# Patient Record
Sex: Female | Born: 1965
Health system: Southern US, Community
[De-identification: ages and names within clinical notes are randomized; demographics above are authoritative.]

## PROBLEM LIST (undated history)

## (undated) DIAGNOSIS — I10 Essential (primary) hypertension: Secondary | ICD-10-CM

## (undated) DIAGNOSIS — F419 Anxiety disorder, unspecified: Secondary | ICD-10-CM

## (undated) HISTORY — PX: ABDOMINAL HYSTERECTOMY: SHX81

---

## 2001-02-20 ENCOUNTER — Encounter: Admission: RE | Admit: 2001-02-20 | Discharge: 2001-02-20 | Payer: Self-pay | Admitting: *Deleted

## 2001-02-20 ENCOUNTER — Encounter: Payer: Self-pay | Admitting: *Deleted

## 2001-06-11 ENCOUNTER — Other Ambulatory Visit: Admission: RE | Admit: 2001-06-11 | Discharge: 2001-06-11 | Payer: Self-pay | Admitting: Obstetrics and Gynecology

## 2003-04-03 ENCOUNTER — Other Ambulatory Visit: Admission: RE | Admit: 2003-04-03 | Discharge: 2003-04-03 | Payer: Self-pay | Admitting: Obstetrics and Gynecology

## 2003-05-12 ENCOUNTER — Ambulatory Visit (HOSPITAL_COMMUNITY): Admission: RE | Admit: 2003-05-12 | Discharge: 2003-05-12 | Payer: Self-pay | Admitting: Obstetrics and Gynecology

## 2003-05-12 ENCOUNTER — Encounter: Payer: Self-pay | Admitting: Obstetrics and Gynecology

## 2004-07-08 ENCOUNTER — Other Ambulatory Visit: Admission: RE | Admit: 2004-07-08 | Discharge: 2004-07-08 | Payer: Self-pay | Admitting: Obstetrics and Gynecology

## 2005-09-10 ENCOUNTER — Other Ambulatory Visit: Admission: RE | Admit: 2005-09-10 | Discharge: 2005-09-10 | Payer: Self-pay | Admitting: Obstetrics and Gynecology

## 2007-04-12 ENCOUNTER — Encounter (INDEPENDENT_AMBULATORY_CARE_PROVIDER_SITE_OTHER): Payer: Self-pay | Admitting: Obstetrics and Gynecology

## 2007-04-12 ENCOUNTER — Ambulatory Visit (HOSPITAL_COMMUNITY): Admission: RE | Admit: 2007-04-12 | Discharge: 2007-04-12 | Payer: Self-pay | Admitting: Obstetrics and Gynecology

## 2007-12-02 ENCOUNTER — Emergency Department (HOSPITAL_COMMUNITY): Admission: EM | Admit: 2007-12-02 | Discharge: 2007-12-02 | Payer: Self-pay | Admitting: Emergency Medicine

## 2008-12-28 ENCOUNTER — Encounter (INDEPENDENT_AMBULATORY_CARE_PROVIDER_SITE_OTHER): Payer: Self-pay | Admitting: Obstetrics and Gynecology

## 2008-12-28 ENCOUNTER — Ambulatory Visit (HOSPITAL_COMMUNITY): Admission: RE | Admit: 2008-12-28 | Discharge: 2008-12-29 | Payer: Self-pay | Admitting: Obstetrics and Gynecology

## 2011-01-23 LAB — COMPREHENSIVE METABOLIC PANEL
ALT: 28 U/L (ref 0–35)
AST: 30 U/L (ref 0–37)
Albumin: 4.1 g/dL (ref 3.5–5.2)
Alkaline Phosphatase: 48 U/L (ref 39–117)
BUN: 11 mg/dL (ref 6–23)
CO2: 26 mEq/L (ref 19–32)
Calcium: 9.3 mg/dL (ref 8.4–10.5)
Chloride: 105 mEq/L (ref 96–112)
Creatinine, Ser: 0.48 mg/dL (ref 0.4–1.2)
GFR calc Af Amer: >60 mL/min (ref >60)
GFR calc non Af Amer: >60 mL/min (ref >60)
Glucose, Bld: 80 mg/dL (ref 70–99)
Potassium: 3.8 mEq/L (ref 3.5–5.1)
Sodium: 137 mEq/L (ref 135–145)
Total Bilirubin: 0.7 mg/dL (ref 0.3–1.2)
Total Protein: 7.5 g/dL (ref 6.0–8.3)

## 2011-01-23 LAB — CBC
HCT: 31.5 % — ABNORMAL LOW (ref 36.0–46.0)
HCT: 37.9 % (ref 36.0–46.0)
Hemoglobin: 10.3 g/dL — ABNORMAL LOW (ref 12.0–15.0)
Hemoglobin: 12.3 g/dL (ref 12.0–15.0)
MCHC: 32.5 g/dL (ref 30.0–36.0)
MCHC: 32.8 g/dL (ref 30.0–36.0)
MCV: 83.2 fL (ref 78.0–100.0)
MCV: 83.8 fL (ref 78.0–100.0)
Platelets: 286 10*3/uL (ref 150–400)
Platelets: 331 10*3/uL (ref 150–400)
RBC: 3.75 MIL/uL — ABNORMAL LOW (ref 3.87–5.11)
RBC: 4.56 MIL/uL (ref 3.87–5.11)
RDW: 13.3 % (ref 11.5–15.5)
RDW: 13.3 % (ref 11.5–15.5)
WBC: 5.1 10*3/uL (ref 4.0–10.5)
WBC: 9.8 10*3/uL (ref 4.0–10.5)

## 2011-02-25 NOTE — Discharge Summary (Signed)
NAMEERMINIA, MCNEW             ACCOUNT NO.:  [id number]   MEDICAL RECORD NO.:  [id number]          PATIENT TYPE:  OIB   LOCATION:  9308                          FACILITY:  [facility]   PHYSICIAN:  Dineen Kid. Rana Snare, M.D.    DATE OF BIRTH:  10-28-1965   DATE OF ADMISSION:  12/28/2008  DATE OF DISCHARGE:  12/29/2008                               DISCHARGE SUMMARY   Ms. Kinkaid is a 45 year old G1, P1, status post tubal ligation and  NovaSure endometrial ablation.  She is having worsened problems with  pelvic pain, which is equivocal in nature, does not have any bleeding at  this time since she has had her NovaSure.  However, her pain is  incapacitating and also monthly, currently it is about 3-4 days a month,  which does keep her from performing her social activities or working.  She also has menstrually related migraines, followed by headache  specialist.  She presents for hysterectomy with removal of both tubes  and ovaries.   HOSPITAL COURSE:  The patient underwent a laparoscopically assisted  vaginal hysterectomy with bilateral salpingo-oophorectomy.  The surgery  was uncomplicated with estimated blood loss of 200 mL.  Her  postoperative care was unremarkable.  On postoperative day #1, she was  ambulating and tolerating regular diet, able to void without difficulty.  She had normal active bowel sounds.  Her postoperative hemoglobin is  10.3, and pain was well managed with oral medication, and hot flushes  controlled with Climara 0.1 mg.   DISPOSITION:  The patient will be discharged home and will follow up in  the office in 1-2 weeks.  She is sent home with a prescription for  Gynodiol 2 mg daily and Percocet #30.  Told to return for increased  pain, fever, or bleeding.      Dineen Kid Rana Snare, M.D.  Electronically Signed     DCL/MEDQ  D:  12/29/2008  T:  12/29/2008  Job:  [id number]

## 2011-02-25 NOTE — H&P (Signed)
[name], [name]             ACCOUNT NO.:  [id number]   MEDICAL RECORD NO.:  [id number]          PATIENT TYPE:  AMB   LOCATION:  SDC                           FACILITY:  [facility]   PHYSICIAN:  Dineen Kid. Rana Snare, M.D.    DATE OF BIRTH:  Jul 24, 1966   DATE OF ADMISSION:  12/28/2008  DATE OF DISCHARGE:                              HISTORY & PHYSICAL   HISTORY OF PRESENT ILLNESS:  [name] is a 45 year old G1, P1,  status post tubal ligation with worsening problems with pelvic pain  which is cyclical in nature.  She is not having bleeding at this time.  She has had a NovaSure endometrial ablation but her pain is  incapacitating and it is also monthly.  Cramping currently is 3-4 days a  month.  She does take anti-inflammatory medications on a regular basis  when this occurs, but does miss work and social activities because of  the pain.  She also has a problem with menstrual migraines.  She is  followed by headache specialist.  Headaches are also in a cyclical  fashion consistent with the menstrual migraines.  We tried Depo-Provera,  it did not really help at all.  She has had a lengthy discussion with  her neurologist and they think that removal of both ovaries and  eventually putting her into menopause would be the recommended route,  and she does wish to proceed with that recommendation.   PAST MEDICAL HISTORY:  Anxiety and migraines.   MEDICATIONS:  1. Verapamil 240 mg daily.  2. Effexor 37.5 mg daily.   PAST SURGICAL HISTORY:  She had a cesarean section in 1991.  She had an  endometrial ablation and tubal ligation in June 2008.  She has an  allergy to amoxicillin and Macrobid.   PHYSICAL EXAMINATION:  VITAL SIGNS:  Blood pressure 118/68,  HEART:  Regular rate and rhythm.  LUNGS:  Clear to auscultation bilaterally.  ABDOMEN:  Nondistended, nontender.  PELVIC:  Exam of the uterus is anteverted, mobile.  Tender to deep  palpation.  No masses are palpable.  She has no significant  cystocele or  rectocele.   IMPRESSION AND PLAN:  Pelvic pain, cyclical nature and cyclical  menstrual migraines, not responding to conservative management.  The  patient desires removal of both ovaries as well as removal of the  uterus.  Plan LAVH with BSO.  Had a lengthy discussion with the patient  with regards to the possibility of preserving or removing one or both  ovaries.  Discussed menopause.  Discussed hormone replacement therapy.  Discussed the risks and benefits of all the above including the [facility].  She does also have a history of endometriosis and  again wants to proceed with hysterectomy with removal of both tubes and  ovaries.  Discussed the procedure at  length which includes but is not limited to risk of infection, bleeding,  damage to bowel, bladder, ureters, ovaries, risks associated with  anesthesia with associated blood transfusion, possibly this may not  alleviate the pain, but could recur or worsen if she does give her  informed  consent and wishes to proceed.      Dineen Kid Rana Snare, M.D.  Electronically Signed     DCL/MEDQ  D:  12/27/2008  T:  12/28/2008  Job:  [id number]

## 2011-02-25 NOTE — Op Note (Signed)
NAMEMARILI, [name]             ACCOUNT NO.:  [id number]   MEDICAL RECORD NO.:  [id number]          PATIENT TYPE:  OIB   LOCATION:  9308                          FACILITY:  [facility]   PHYSICIAN:  Dineen Kid. Rana Snare, M.D.    DATE OF BIRTH:  12-28-1965   DATE OF PROCEDURE:  12/28/2008  DATE OF DISCHARGE:                               OPERATIVE REPORT   PREOPERATIVE DIAGNOSES:  Pelvic pain and menstrual-related migraines and  history of endometriosis.   POSTOPERATIVE DIAGNOSES:  Pelvic pain and menstrual-related migraines  and history of endometriosis.   PROCEDURE:  Laparoscopic-assisted vaginal hysterectomy with bilateral  salpingo-oophorectomy.   SURGEON:  Dineen Kid. Rana Snare, MD   ASSISTANT:  Zelphia Cairo, MD   INDICATIONS:  Ms. Dadisman is a 45 year old G1, P1 status post tubal  ligation with worsening of problems with pelvic pain which is cyclical  in nature.  She is not having bleeding at this time since she had  NovaSure endometrial ablation, but she does have incapacitating pain 3-4  days amongst which does prevent her from functioning normally both  socially and at work not responding to anti-inflammatory medications or  Depo-Provera.  She also has menstrual-related migraines, is followed by  headache specialist for this.  They are in a cyclical fashion.  She  desires removal of both ovaries.  Because of this, the risks and  benefits of the procedure were discussed at length, which include but  not limited to risk of infection, bleeding, damage to bowel, bladder,  ureters, risks associated with anesthesia, blood transfusion, possibly  this may not alleviate the pain, it could recur or worsen, risks  associated with hormone replacement therapy and menopause were also  discussed, gives informed consent, and wished to proceed.   FINDINGS:  At the time of surgery were normal-appearing uterus, tubes,  and ovaries, normal-appearing appendix and liver.   DESCRIPTION OF PROCEDURE:   After adequate analgesia, the patient placed  in the dorsal lithotomy position.  She was sterilely prepped and draped.  Bladder sterilely drained.  Graves speculum was placed.  Tenaculum was  placed on anterior lip of the cervix and a Cohen tenaculum was then  placed.  A 1-cm infraumbilical skin incision was made.  A Veress needle  was inserted.  The abdomen was insufflated, dullness to percussion.  A  11-mm trocar was inserted.  The above findings were noted by  laparoscope.  A 5-mm trocar was inserted to the left midline 2  fingerbreadths above the pubic symphysis.  Gyrus cutting forceps were  used to grasp and dissect along the right infundibulopelvic ligament  down to the round ligament.  The left infundibulopelvic ligament was  similarly grasped, coagulated, and dissected down across the round  ligament with the ovaries and fallopian tubes falling towards the  uterus.  Good hemostasis noted and care was taken to avoid the  underlying ureter and bowel.  The bladder flap was then elevated and the  gyrus was used to create a vesicouterine flap.  The abdomen was then  desufflated.  The legs repositioned.  Weighted speculum was placed in  the vagina,  posterior colpotomy was performed.  The cervix was  circumscribed with Bovie cautery.  LigaSure was then used to ligate  across the uterosacral ligaments bilaterally.  The cardinal ligaments in  the bladder pillars bilaterally dissected with Mayo scissors.  The  anterior vaginal mucosa was dissected off the anterior cervix and  anterior peritoneum was entered sharply.  Deaver retractor placed  underneath the bladder.  The inferior portion of the broad ligament were  then dissected and ligated with LigaSure instrument.  The uterus,  fallopian tubes, and ovary were then removed.  The uterosacral ligaments  were identified and ligated with 0-Monocryl suture in a figure-of-eight  fashion bilaterally.  The cul-de-sac was closed with pursestring  fashion  with 0-Monocryl suture.  The vagina was then closed in a vertical  fashion using figure-of-eights of 0-Monocryl with good closure and good  hemostasis noted.  Foley catheter was then placed with return of clear  yellow urine.  Legs were repositioned, abdomen reinsufflated, and Nezhat  suction irrigator was used to irrigate the pelvis.  Small peritoneal  bleeders were coagulated with bipolar cautery.  Pedicles were checked  and were noted be hemostatic.  The underlying ureter showed good  peristalsis bilaterally and cul-de-sac was clean and dry.  The abdomen  was then desufflated, trocars removed.  The infraumbilical skin incision  was closed with 0-Vicryl interrupted suture, the fascia 3-0 Vicryl  Rapide subcuticular suture, the 5-mm site was closed with 3-0 Vicryl  Rapide subcuticular suture, and Dermabond.  Incisions were then injected  with 0.25% Marcaine, total 10 mL used.  The patient tolerated the  procedure well, was stable on transfer to recovery room.  Sponge and  instrument count was normal x3.  The patient did receive clindamycin and  Cipro preoperatively.      Dineen Kid Rana Snare, M.D.  Electronically Signed     DCL/MEDQ  D:  12/28/2008  T:  12/28/2008  Job:  [id number]

## 2011-02-28 NOTE — Op Note (Signed)
[name], [name]             ACCOUNT NO.:  [id number]   MEDICAL RECORD NO.:  [id number]          PATIENT TYPE:  AMB   LOCATION:  SDC                           [facility]   PHYSICIAN:  [name], M.D.    DATE OF BIRTH:  1966-07-07   DATE OF PROCEDURE:  04/12/2007  DATE OF DISCHARGE:                               OPERATIVE REPORT   PREOPERATIVE DIAGNOSES:  1. Abnormal uterine bleeding.  2. Menorrhagia.  3. Dysmenorrhea.  4. Pelvic pain.  5. Family history of endometriosis.  6. Submucosal fibroids.  7. Desires sterility.   POSTOPERATIVE DIAGNOSES:  1. Abnormal uterine bleeding.  2. Menorrhagia.  3. Dysmenorrhea.  4. Pelvic pain.  5. Family history of endometriosis.  6. Submucosal fibroids.  7. Desires sterility.  8. Mild endometriosis.   SURGEON:  Dr. Candice Camp.   ANESTHESIA:  General endotracheal.   PROCEDURE:  1. Laparoscopy with ablation of endometriosis implants.  2. Bilateral tubal ligation.  3. Hysteroscopy with dilation and curettage.  4. NovaSure endometrial ablation.   FINDINGS AT TIME OF SURGERY:  Retroflexed uterus with normal-appearing  ovaries, fallopian tubes.  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  The left uterosacral ligament had endometriosis implants with a small  McMaster's window.  The right uterosacral ligament into the ovarian  fossa had several small 2- to 3-mm endometriosis implants with some  puckering peritoneum.  Normal-appearing liver.  The appendix was  retrocecal.  On hysteroscopy, she does have a submucosal fibroid  anteriorly near the fundus which has only a small portion of  intracavitary component.  Otherwise normal-appearing ostia and uterus.   ESTIMATED BLOOD LOSS:  Was minimal.   SORBITOL DEFICIT:  Was 200 mL.   DESCRIPTION OF PROCEDURE:  After adequate analgesia, the patient placed  in the dorsal lithotomy position, she was sterilely prepped and draped.  Bladder was sterilely drained.  Graves speculum  was placed.  A Cohen  tenaculum was placed on the cervix.  Legs were repositioned.  A 1-cm  infraumbilical skin incision was made.  A Veress needle was inserted.  The abdomen was insufflated to auscultation and percussion.  An 11-mm  trocar was inserted.  Laparoscope was inserted.  The above findings were  noted.  A 5-mm trocar was inserted to the left of the midline 2  fingerbreadths above the pubic symphysis.  Ablation of endometriosis was  carried out using bipolar cautery, care taken to avoid the underlying  ureter and bowel, with good ablation of the endometriosis implants  noted.  The McMaster's window was grasped at the base of the implant and  ablated with closure of the McMaster's window noted.  The left fallopian  tube was identified by fimbriated end.  Midportion of tube was grasped.  Bipolar cautery was used to cauterize an approximately 2- to 3-cm  section of fallopian tube with good thermal burn noted across the  entirety the tube and loss of resistance on ohmmeter.  The right  fallopian tube was identified by fimbriated end.  Midportion of tube  grasped with the bipolar cautery.  Cauterization was carried out over  a  2- to 3-cm section of the fallopian tube with good thermal burn noted  and also loss of resistance on the ohmmeter.  The 5-mm trocar site was  removed.  Good hemostasis was noted to be achieved.  This trocar was  then removed from the umbilicus.  Abdomen was desufflated.  Infraumbilical skin incision was closed with a 0 Vicryl figure-of-eight  in the fascia, 3-0 Vicryl Rapide subcuticular suture.  The 5-mm site was  closed with a 3-0 Vicryl Rapide subcuticular suture and then Dermabond.  The incisions were injected with 0.25% Marcaine, a total of 10 mL used.  The legs were repositioned.  Tenaculum placed on the anterior lip of the  cervix.  Uterus was sounded to 8 cm with a cervical length of 4 cm for a  total cavity length of 4 cm.  Hysteroscope was inserted  after dilation  and noted to have small irregularities on the posterior wall.  The  curettage was performed, retrieving small fragments of endometrium.  Reexamination with the hysteroscope revealed minimal intracavitary  component of the submucosal fibroid, so resection was not carried out  because of this and due to severe retroflexion of the uterus.  The  NovaSure device was placed with a cavity with a 4.5-cm.  Cavity  assessment test was carried out.  After that had passed, the device was  activated with a power of 99 watts for total of 1 minute 35 seconds.  The device was then removed.  Reexamination of the hysteroscope revealed  good thermal burn.  No obvious complications were noted.  The  hysteroscope was removed.  Tenaculum removed from cervix.  Cervix noted  to be hemostatic.  Speculum was then removed.  The patient was  transferred to the recovery room in stable condition.  Sponge and  instrument count was normal x3.  Estimated blood loss was minimal.  Sorbitol deficit was 200 mL.  Again, the sponge, needle and instrument  count was normal x3.  The patient received clindamycin 900 mg and  gentamicin 120 mg preoperatively, Toradol 30 mg postoperatively.   DISPOSITION:  The patient will be discharged home.  Will follow-up in  the office in 2 to 3 weeks.  Sent home with a routine instruction sheet  for laparoscopy and for D&C.  Told to return for increased pain, fever  or bleeding.  Sent home with a prescription for Vicodin.      Terri Kid Rana Moran, M.D.  Electronically Signed     DCL/MEDQ  D:  04/12/2007  T:  04/12/2007  Job:  [id number]

## 2011-02-28 NOTE — H&P (Signed)
[name], [name]             ACCOUNT NO.:  [id number]   MEDICAL RECORD NO.:  [id number]          PATIENT TYPE:  AMB   LOCATION:  SDC                           [facility]ACILITY[facility]   PHYSICIAN:  Dineen Kid. Rana Snare, M.D.    DATE OF BIRTH:  08-Mar-1966   DATE OF ADMISSION:  04/12/2007  DATE OF DISCHARGE:                              HISTORY & PHYSICAL   HISTORY OF PRESENT ILLNESS:  [name] is a 45 year old G1, P1 with  worsening abnormal uterine bleeding, dysmenorrhea, menorrhagia, pelvic  pain, family history of endometriosis.  Also, on saline infusion  ultrasound, she has a submucosal fibroid.  She desires definitive  surgical intervention such as a hysterectomy but insurance at this time  has denied this.  So, at this time, we plan to proceed with laparoscopic  evaluation of the endometriosis with tubal ligation, as she does no  longer have childbearing desires and possible evaluation and treatment  of endometriosis.  She also desires resection of the submucosal fibroid  and Novasure endometrial ablation for the menorrhagia.  Saline infusion  ultrasound shows a small left ovarian cyst.  Right ovary is normal.  Uterus is 7.7 x 3.6 x 4.8 cm in size, a 9 mm submucosal fibroid is seen  with half of it in the endometrial cavity.  The other half is below the  endometrium.   PHYSICAL EXAMINATION:  PELVIC:  The uterus is anteverted, mobile,  nontender.  No uterine sacral nodularity is palpable.  HEART:  Regular rate and rhythm.  LUNGS:  Clear to auscultation bilaterally.  ABDOMEN:  Nondistended, nontender.  VITAL SIGNS:  Blood pressure is 112/70.  Her weight is 119.   PAST MEDICAL HISTORY:  Negative.  She has had a cesarean section in the  past.   She has an allergy to AMOXICILLIN.   IMPRESSION/PLAN:  Abnormal uterine bleeding, menorrhagia, dysmenorrhea,  pelvic pain, family history of endometriosis, submucosal fibroids, and  also desires sterility.  Plan laparoscopic tubal ligation,  possible  ablation of endometriosis or lysis of adhesions.  Also plan  hysteroscopic resection of the submucosal fibroid and NovaSure  endometrial ablation.  Discussed the risks and benefits of the above  procedures at length, which include but are not limited to, risk of  infection, bleeding, damage to the ureter, tubes, ovaries,  bowel/bladder, risk of tubal failure, quoted at 5 out of 1000.  I  discussed the success rates and complication rates associated with the  NovaSure ablation technique.  I discussed the risks associated with a  blood transfusion, general anesthesia.  The possibility of these  procedures may not alleviate the pain, it could recur or worsen.  The  same goes also for the abnormal bleeding.  She does give her informed  consent and wishes to proceed.      Dineen Kid Rana Snare, M.D.  Electronically Signed     DCL/MEDQ  D:  04/09/2007  T:  04/09/2007  Job:  [id number]

## 2011-02-28 NOTE — Discharge Summary (Signed)
NAMEJEROLENE, [name]             ACCOUNT NO.:  [id number]   MEDICAL RECORD NO.:  [id number]          PATIENT TYPE:  OIB   LOCATION:  9308                          [facility]ACILITY[facility]   PHYSICIAN:  Dineen Kid. Rana Snare, M.D.    DATE OF BIRTH:  [birth year]   DATE OF ADMISSION:  12/28/2008  DATE OF DISCHARGE:  12/29/2008                               DISCHARGE SUMMARY   HISTORY OF PRESENT ILLNESS:  [name] is a 45 year old G1, P1 status  post tubal ligation with worsening problems of pain, cyclical in nature,  having bleeding at this time, states she has had NovaSure endometrial  ablation, but she does have incapacitating pain, which is also monthly  and cramping, 3-4 days a month.  She takes antiinflammatory medications  on a regular basis when this does occur and does miss work and social  activities because of her pain.  Also, has a problem with menstrual  migraines, followed by a headache specialist.  They are cyclical in  nature.  We have tried Depo-Provera without any help at home.  After a  lengthy discussion with her neurologist and the patient, she desires  removal of both ovaries and effectively put an end to her surgical  menopause to help with the migraines.  She presents for LAVH and BSO.  Risks and benefits were discussed.  Informed consent was obtained.   HOSPITAL COURSE:  The patient underwent an LAVH and BSO.  The surgery  was uncomplicated.  Her blood loss at the time of surgery was 200 mL.  There were no complications.  Her postoperative care was unremarkable.  By postop day #1, she was ambulating without difficulty and tolerating a  regular diet.  Her postop hemoglobin was 10.3.  The incision was clean,  dry, and intact.  She had a normoactive bowel sounds and the patient was  discharged home.   DISPOSITION:  The patient was discharged home.  Followup in the office  in 1-2 weeks.  Given the routine instruction sheet for hysterectomy.  Told to return for increased pain,  fever, or bleeding.  She also has a  prescription for Percocet #20.      Dineen Kid Rana Snare, M.D.  Electronically Signed     DCL/MEDQ  D:  01/22/2009  T:  01/23/2009  Job:  [id number]

## 2011-07-07 LAB — POCT CARDIAC MARKERS
CKMB, poc: 2.8
Myoglobin, poc: 148
Operator id: 288831
Troponin i, poc: <0.05
Troponin i, poc: <0.05

## 2011-07-07 LAB — URINALYSIS, ROUTINE W REFLEX MICROSCOPIC
Glucose, UA: NEGATIVE
Nitrite: NEGATIVE
Protein, ur: NEGATIVE
Urobilinogen, UA: 1

## 2011-07-07 LAB — I-STAT 8, (EC8 V) (CONVERTED LAB)
BUN: 10
Bicarbonate: 23.3
Chloride: 108
Glucose, Bld: 81
HCT: 40
Hemoglobin: 13.6
Operator id: 114141
Sodium: 140
pCO2, Ven: 38.4 — ABNORMAL LOW

## 2011-07-07 LAB — DIFFERENTIAL
Basophils Absolute: 0
Eosinophils Absolute: 0.1
Eosinophils Relative: 2
Lymphocytes Relative: 21
Monocytes Absolute: 0.6

## 2011-07-07 LAB — CBC
HCT: 36.5
Hemoglobin: 12
MCV: 80.1
Platelets: 328
RDW: 13.2

## 2011-07-07 LAB — D-DIMER, QUANTITATIVE: D-Dimer, Quant: <0.22

## 2011-07-30 LAB — CBC
HCT: 34.4 — ABNORMAL LOW
Hemoglobin: 11.2 — ABNORMAL LOW
MCHC: 32.5
MCV: 79.7
Platelets: 361
RBC: 4.32
RDW: 13.6
WBC: 7.8

## 2011-07-30 LAB — PREGNANCY, URINE: Preg Test, Ur: NEGATIVE

## 2014-11-08 ENCOUNTER — Other Ambulatory Visit: Payer: Self-pay | Admitting: Obstetrics & Gynecology

## 2014-11-09 LAB — CYTOLOGY - PAP

## 2015-10-16 MED FILL — ESTRADIOL 1 MG TABLET: 1 | 90 days supply | Qty: 90 | Fill #1

## 2015-11-12 MED FILL — VENLAFAXINE HCL ER 150 MG C: 150 | 90 days supply | Qty: 90 | Fill #0

## 2015-11-30 MED FILL — ESTRADIOL 2 MG TABLET: 2 | 30 days supply | Qty: 30 | Fill #0

## 2016-01-21 MED FILL — ESTRADIOL 2 MG TABLET: 2 | 30 days supply | Qty: 30 | Fill #1

## 2016-02-20 MED FILL — ESTRADIOL 2 MG TABLET: 2 | 30 days supply | Qty: 30 | Fill #2

## 2016-03-05 MED FILL — VENLAFAXINE HCL ER 150 MG C: 150 | 90 days supply | Qty: 90 | Fill #1

## 2016-04-01 MED FILL — ESTRADIOL 2 MG TABLET: 2 | 30 days supply | Qty: 30 | Fill #3

## 2016-05-07 MED FILL — ESTRADIOL 2 MG TABLET: 2 | 30 days supply | Qty: 30 | Fill #4

## 2016-06-23 MED FILL — VENLAFAXINE HCL ER 150 MG C: 150 | 90 days supply | Qty: 90 | Fill #2

## 2016-06-25 MED FILL — ESTRADIOL 2 MG TABLET: 2 | 30 days supply | Qty: 30 | Fill #5

## 2016-07-28 DIAGNOSIS — H3562 Retinal hemorrhage, left eye: Secondary | ICD-10-CM | POA: Diagnosis not present

## 2016-08-07 MED FILL — ESTRADIOL 2 MG TABLET: 2 | 30 days supply | Qty: 30 | Fill #6

## 2016-09-10 MED FILL — ESTRADIOL 2 MG TABLET: 2 | 30 days supply | Qty: 30 | Fill #7

## 2016-10-16 MED FILL — ESTRADIOL 2 MG TABLET: 2 | 30 days supply | Qty: 30 | Fill #8

## 2016-10-20 MED FILL — VENLAFAXINE HCL ER 150 MG C: 150 | 90 days supply | Qty: 90 | Fill #3

## 2016-10-30 ENCOUNTER — Emergency Department (HOSPITAL_BASED_OUTPATIENT_CLINIC_OR_DEPARTMENT_OTHER)
Admission: EM | Admit: 2016-10-30 | Discharge: 2016-10-30 | Disposition: A | Discharge status: Home / Self Care | Payer: BLUE CROSS/BLUE SHIELD | Attending: Emergency Medicine | Admitting: Emergency Medicine

## 2016-10-30 ENCOUNTER — Encounter (HOSPITAL_BASED_OUTPATIENT_CLINIC_OR_DEPARTMENT_OTHER): Payer: Self-pay | Admitting: Emergency Medicine

## 2016-10-30 DIAGNOSIS — N390 Urinary tract infection, site not specified: Secondary | ICD-10-CM | POA: Insufficient documentation

## 2016-10-30 DIAGNOSIS — R300 Dysuria: Secondary | ICD-10-CM | POA: Diagnosis present

## 2016-10-30 HISTORY — DX: Anxiety disorder, unspecified: F41.9

## 2016-10-30 LAB — URINALYSIS, MICROSCOPIC (REFLEX): RBC / HPF: NONE SEEN RBC/hpf (ref 0–5)

## 2016-10-30 LAB — URINALYSIS, ROUTINE W REFLEX MICROSCOPIC
GLUCOSE, UA: NEGATIVE mg/dL
HGB URINE DIPSTICK: NEGATIVE
KETONES UR: 15 mg/dL — AB
NITRITE: POSITIVE — AB
PH: 7.5 (ref 5.0–8.0)
Protein, ur: 30 mg/dL — AB
Specific Gravity, Urine: 1.022 (ref 1.005–1.030)

## 2016-10-30 MED ORDER — CIPROFLOXACIN HCL 500 MG PO TABS: 500.0000 mg | ORAL_TABLET | Freq: Two times a day (BID) | ORAL | 0 refills | Status: DC

## 2016-10-30 MED ORDER — ONDANSETRON HCL 4 MG PO TABS: 4.0000 mg | ORAL_TABLET | Freq: Four times a day (QID) | ORAL | 0 refills | Status: DC

## 2016-10-30 MED FILL — CIPROFLOXACIN HCL 500 MG TA: 500 | 7 days supply | Qty: 14 | Fill #0

## 2016-10-30 MED FILL — ONDANSETRON HCL 4 MG TABLET: 4 | 3 days supply | Qty: 12 | Fill #0

## 2016-10-30 NOTE — ED Triage Notes (Signed)
Patient states that she has had pain with urination for the last 2 days.

## 2016-10-30 NOTE — Discharge Instructions (Signed)
Please read attached information. If you experience any new or worsening signs or symptoms please return to the emergency room for evaluation. Please follow-up with your primary care provider or specialist as discussed. Please use medication prescribed only as directed and discontinue taking if you have any concerning signs or symptoms.   °

## 2016-10-30 NOTE — ED Provider Notes (Signed)
[facility] Provider Note   CSN: [id number] Arrival date & time: 10/30/16  1253     History   Chief Complaint Chief Complaint  Patient presents with  . Dysuria    HPI [name] is a 51 y.o. female.  HPI   51 year old female presents today with complaints of dysuria. Patient reports symptoms started 3 days ago with burning with urination. She notes very minor suprapubic pain, and pain to her right flank. Patient notes that she took AZO last night, had one episode of diarrhea and vomiting today. Patient reports she's been afebrile, called her primary care this morning who instructed her to come to the emergency room.   Past Medical History:  Diagnosis Date  . Anxiety     There are no active problems to display for this patient.   Past Surgical History:  Procedure Laterality Date  . ABDOMINAL HYSTERECTOMY      OB History    No data available       Home Medications    Prior to Admission medications   Medication Sig Start Date End Date Taking? Authorizing Provider  venlafaxine (EFFEXOR) 75 MG tablet Take 75 mg by mouth 2 (two) times daily.   Yes Historical Provider, MD  ciprofloxacin (CIPRO) 500 MG tablet Take 1 tablet (500 mg total) by mouth 2 (two) times daily. 10/30/16   Tinnie GensJeffrey Nazly Digilio, PA-C  ondansetron (ZOFRAN) 4 MG tablet Take 1 tablet (4 mg total) by mouth every 6 (six) hours. 10/30/16   Eyvonne MechanicJeffrey Essance Gatti, PA-C    Family History History reviewed. No pertinent family history.  Social History Social History  Substance Use Topics  . Smoking status: Never Smoker  . Smokeless tobacco: Never Used  . Alcohol use Yes     Comment: occ     Allergies   Macrobid [nitrofurantoin macrocrystal] and Amoxicillin   Review of Systems Review of Systems  All other systems reviewed and are negative.    Physical Exam Updated Vital Signs BP 101/73 (BP Location: Right Arm)   Pulse 105   Temp 99.6 F (37.6 C) (Oral)   Resp 16   Ht 4' 10.5"  (1.486 m)   Wt 58.1 kg   SpO2 97%   BMI 26.30 kg/m   Physical Exam  Constitutional: She is oriented to person, place, and time. She appears well-developed and well-nourished.  HENT:  Head: Normocephalic and atraumatic.  Eyes: Conjunctivae are normal. Pupils are equal, round, and reactive to light. Right eye exhibits no discharge. Left eye exhibits no discharge. No scleral icterus.  Neck: Normal range of motion. No JVD present. No tracheal deviation present.  Pulmonary/Chest: Effort normal. No stridor.  Abdominal: Soft. She exhibits no distension.  No CVA tenderness, minor suprapubic tenderness to palpation, remainder of abdominal exam benign  Neurological: She is alert and oriented to person, place, and time. Coordination normal.  Skin: Skin is warm.  Psychiatric: She has a normal mood and affect. Her behavior is normal. Judgment and thought content normal.  Nursing note and vitals reviewed.    ED Treatments / Results  Labs (all labs ordered are listed, but only abnormal results are displayed) Labs Reviewed  URINALYSIS, ROUTINE W REFLEX MICROSCOPIC - Abnormal; Notable for the following:       Result Value   Color, Urine ORANGE (*)    Bilirubin Urine SMALL (*)    Ketones, ur 15 (*)    Protein, ur 30 (*)    Nitrite POSITIVE (*)    Leukocytes,  UA SMALL (*)    All other components within normal limits  URINALYSIS, MICROSCOPIC (REFLEX) - Abnormal; Notable for the following:    Bacteria, UA MANY (*)    Squamous Epithelial / LPF 6-30 (*)    All other components within normal limits    EKG  EKG Interpretation None       Radiology No results found.  Procedures Procedures (including critical care time)  Medications Ordered in ED Medications - No data to display   Initial Impression / Assessment and Plan / ED Course  I have reviewed the triage vital signs and the nursing notes.  Pertinent labs & imaging results that were available during my care of the patient were  reviewed by me and considered in my medical decision making (see chart for details).     Final Clinical Impressions(s) / ED Diagnoses   Final diagnoses:  Urinary tract infection without hematuria, site unspecified    Labs: Urinalysis  Imaging:  Consults:  Therapeutics: Cipro  Discharge Meds:   Assessment/Plan: 51 year old female presents today with complaints of urinary tract infection. She reports today's presentation is similar to previous. Patient reports vague right flank pain, has no CVA tenderness. She is nitrite positive here, and slightly tachycardic. Patient reports that she has not had anything to drink this morning. She has been tolerating by mouth throughout her stay here in the ED with no episodes of vomiting. Patient could potentially have early pyelonephritis, she is otherwise healthy and tolerating by mouth. She will be discharged home with oral antibiotics, antinausea medication and close follow-up with either her primary care or the emergency room. I discussed the need for further evaluation if her symptoms worsened including fever, vomiting, or any worsening signs or symptoms. Both the patient and her husband verbalized understanding and agreement to today's plan had no further questions or concerns at time discharge    New Prescriptions Discharge Medication List as of 10/30/2016  3:38 PM    START taking these medications   Details  ciprofloxacin (CIPRO) 500 MG tablet Take 1 tablet (500 mg total) by mouth 2 (two) times daily., Starting Thu 10/30/2016, Print    ondansetron (ZOFRAN) 4 MG tablet Take 1 tablet (4 mg total) by mouth every 6 (six) hours., Starting Thu 10/30/2016, Print         [employer], PA-C 10/30/16 1638    Geoffery Lyons, MD 10/31/16 0700

## 2016-11-26 MED FILL — ESTRADIOL 2 MG TABLET: 2 | 30 days supply | Qty: 30 | Fill #0

## 2016-12-30 DIAGNOSIS — Z6828 Body mass index (BMI) 28.0-28.9, adult: Secondary | ICD-10-CM | POA: Diagnosis not present

## 2016-12-30 DIAGNOSIS — Z01419 Encounter for gynecological examination (general) (routine) without abnormal findings: Secondary | ICD-10-CM | POA: Diagnosis not present

## 2016-12-30 DIAGNOSIS — Z1231 Encounter for screening mammogram for malignant neoplasm of breast: Secondary | ICD-10-CM | POA: Diagnosis not present

## 2016-12-31 MED FILL — ESTRADIOL 2 MG TABLET: 2 | 90 days supply | Qty: 90 | Fill #0

## 2017-01-05 DIAGNOSIS — M7541 Impingement syndrome of right shoulder: Secondary | ICD-10-CM | POA: Diagnosis not present

## 2017-01-07 MED FILL — ALPRAZolam 0.5 MG TABS: 0.5 | 10 days supply | Qty: 30 | Fill #0

## 2017-02-05 MED FILL — VENLAFAXINE HCL ER 150 MG C: 150 | 90 days supply | Qty: 90 | Fill #0

## 2017-04-10 MED FILL — ESTRADIOL 2 MG TABLET: 2 | 90 days supply | Qty: 90 | Fill #1

## 2017-05-25 MED FILL — VENLAFAXINE HCL ER 150 MG C: 150 | 90 days supply | Qty: 90 | Fill #1

## 2017-07-20 MED FILL — ESTRADIOL 2 MG TABLET: 2 | 90 days supply | Qty: 90 | Fill #2

## 2017-07-29 DIAGNOSIS — Z23 Encounter for immunization: Secondary | ICD-10-CM | POA: Diagnosis not present

## 2017-08-31 MED FILL — VENLAFAXINE HCL ER 150 MG C: 150 | 90 days supply | Qty: 90 | Fill #2

## 2017-09-18 DIAGNOSIS — J069 Acute upper respiratory infection, unspecified: Secondary | ICD-10-CM | POA: Diagnosis not present

## 2017-10-26 MED FILL — ESTRADIOL 2 MG TABLET: 2 | 90 days supply | Qty: 90 | Fill #3

## 2017-12-01 MED FILL — VENLAFAXINE HCL ER 150 MG C: 150 | 90 days supply | Qty: 90 | Fill #3

## 2018-01-07 DIAGNOSIS — Z1231 Encounter for screening mammogram for malignant neoplasm of breast: Secondary | ICD-10-CM | POA: Diagnosis not present

## 2018-01-07 DIAGNOSIS — Z01419 Encounter for gynecological examination (general) (routine) without abnormal findings: Secondary | ICD-10-CM | POA: Diagnosis not present

## 2018-01-07 DIAGNOSIS — Z13820 Encounter for screening for osteoporosis: Secondary | ICD-10-CM | POA: Diagnosis not present

## 2018-01-07 DIAGNOSIS — Z6829 Body mass index (BMI) 29.0-29.9, adult: Secondary | ICD-10-CM | POA: Diagnosis not present

## 2018-01-18 DIAGNOSIS — M25531 Pain in right wrist: Secondary | ICD-10-CM | POA: Diagnosis not present

## 2018-01-18 DIAGNOSIS — M79641 Pain in right hand: Secondary | ICD-10-CM | POA: Diagnosis not present

## 2018-01-20 MED FILL — ALPRAZolam 0.25 MG TABS: 0.25 | 10 days supply | Qty: 30 | Fill #0

## 2018-01-25 MED FILL — ESTRADIOL 2 MG TABLET: 2 | 90 days supply | Qty: 90 | Fill #0

## 2018-01-31 ENCOUNTER — Other Ambulatory Visit: Payer: Self-pay

## 2018-01-31 ENCOUNTER — Encounter (HOSPITAL_BASED_OUTPATIENT_CLINIC_OR_DEPARTMENT_OTHER): Payer: Self-pay | Admitting: Emergency Medicine

## 2018-01-31 ENCOUNTER — Emergency Department (HOSPITAL_BASED_OUTPATIENT_CLINIC_OR_DEPARTMENT_OTHER)
Admission: EM | Admit: 2018-01-31 | Discharge: 2018-01-31 | Disposition: A | Discharge status: Home / Self Care | Payer: BLUE CROSS/BLUE SHIELD | Attending: Emergency Medicine | Admitting: Emergency Medicine

## 2018-01-31 DIAGNOSIS — L509 Urticaria, unspecified: Secondary | ICD-10-CM | POA: Diagnosis not present

## 2018-01-31 DIAGNOSIS — Z79899 Other long term (current) drug therapy: Secondary | ICD-10-CM | POA: Insufficient documentation

## 2018-01-31 DIAGNOSIS — F419 Anxiety disorder, unspecified: Secondary | ICD-10-CM | POA: Insufficient documentation

## 2018-01-31 MED ORDER — DEXAMETHASONE SODIUM PHOSPHATE 10 MG/ML IJ SOLN
10.0000 mg | Freq: Once | INTRAMUSCULAR | Status: AC
  Administered 2018-01-31: 10 mg via INTRAMUSCULAR
  Filled 2018-01-31: qty 1

## 2018-01-31 MED ORDER — PREDNISONE 20 MG PO TABS: ORAL_TABLET | ORAL | 0 refills | Status: DC

## 2018-01-31 NOTE — ED Notes (Signed)
ED Provider at bedside. 

## 2018-01-31 NOTE — Discharge Instructions (Signed)
Please read and follow all provided instructions.  Your diagnoses today include:  1. Urticaria    Tests performed today include:  Vital signs. See below for your results today.   Medications prescribed:   Prednisone - steroid medicine   It is best to take this medication in the morning to prevent sleeping problems. If you are diabetic, monitor your blood sugar closely and stop taking Prednisone if blood sugar is over 300. Take with food to prevent stomach upset.   Take any prescribed medications only as directed.  Home care instructions:   Follow any educational materials contained in this packet  Follow-up instructions: Please follow-up with your primary care provider in the next 3 days for further evaluation of your symptoms.   Return instructions:   Please return to the Emergency Department if you experience worsening symptoms.   Call 9-1-1 immediately if you have an allergic reaction that involves your lips, mouth, throat or if you have any difficulty breathing. This is a life-threatening emergency.   Please return if you have any other emergent concerns.  Additional Information:  Your vital signs today were: BP (!) 145/89    Pulse 88    Temp 98.4 F (36.9 C) (Oral)    Resp 16    Ht 4\' 10"  (1.473 m)    Wt 61.2 kg (135 lb)    SpO2 100%    BMI 28.22 kg/m  If your blood pressure (BP) was elevated above 135/85 this visit, please have this repeated by your doctor within one month. --------------

## 2018-01-31 NOTE — ED Triage Notes (Addendum)
Pt reports hives onset yesterday. Pt has tried benadryl, Tagement, and zyrtec without relief.  When medication wears off symptoms return. Pt denies shortness of breath. Denies use of new products.

## 2018-01-31 NOTE — ED Provider Notes (Signed)
[facility] EMERGENCY DEPARTMENT Provider Note   CSN: [id number] Arrival date & time: 01/31/18  1424     History   Chief Complaint Chief Complaint  Patient presents with  . Urticaria    HPI [name] is a 52 y.o. female.  Patient presents with complaint of hives ongoing over the past 2 days.  She does not have a history of allergic reaction or hives.  They have been generalized over her entire body.  She has had no lip or tongue swelling.  No nausea or vomiting or diarrhea.  No lightheadedness or syncope.  No new medications, skin exposures, environmental exposures that she knows of.  She is scheduled for colonoscopy tomorrow morning.  She has been using antihistamines and other H2 blockers at home.  These helped temporarily but then hives return when the medications wear off. The onset of this condition was acute. The course is waxing and waning. Aggravating factors: none.     Past Medical History:  Diagnosis Date  . Anxiety     There are no active problems to display for this patient.   Past Surgical History:  Procedure Laterality Date  . ABDOMINAL HYSTERECTOMY       OB History   None      Home Medications    Prior to Admission medications   Medication Sig Start Date End Date Taking? Authorizing Provider  ciprofloxacin (CIPRO) 500 MG tablet Take 1 tablet (500 mg total) by mouth 2 (two) times daily. 10/30/16   Hedges, Tinnie GensJeffrey, PA-C  ondansetron (ZOFRAN) 4 MG tablet Take 1 tablet (4 mg total) by mouth every 6 (six) hours. 10/30/16   Hedges, Tinnie GensJeffrey, PA-C  predniSONE (DELTASONE) 20 MG tablet 3 Tabs PO Days 1-3, then 2 tabs PO Days 4-6, then 1 tab PO Day 7-9, then Half Tab PO Day 10-12 01/31/18   Renne CriglerGeiple, Olivea Sonnen, PA-C  venlafaxine (EFFEXOR) 75 MG tablet Take 75 mg by mouth 2 (two) times daily.    [provider]    Family History No family history on file.  Social History Social History   Tobacco Use  . Smoking status: Never Smoker  .  Smokeless tobacco: Never Used  Substance Use Topics  . Alcohol use: Yes    Comment: occ  . Drug use: No     Allergies   Macrobid [nitrofurantoin macrocrystal] and Amoxicillin   Review of Systems Review of Systems  Constitutional: Negative for fever.  HENT: Negative for facial swelling and trouble swallowing.   Eyes: Negative for redness.  Respiratory: Negative for shortness of breath, wheezing and stridor.   Cardiovascular: Negative for chest pain.  Gastrointestinal: Negative for nausea and vomiting.  Musculoskeletal: Negative for myalgias.  Skin: Positive for rash.  Neurological: Negative for light-headedness.  Psychiatric/Behavioral: Negative for confusion.     Physical Exam Updated Vital Signs BP (!) 145/89   Pulse 88   Temp 98.4 F (36.9 C) (Oral)   Resp 16   Ht 4\' 10"  (1.473 m)   Wt 61.2 kg (135 lb)   SpO2 100%   BMI 28.22 kg/m   Physical Exam  Constitutional: She appears well-developed and well-nourished.  HENT:  Head: Normocephalic and atraumatic.  No signs of angioedema.  Eyes: Conjunctivae are normal. Right eye exhibits no discharge. Left eye exhibits no discharge.  Neck: Normal range of motion. Neck supple.  Cardiovascular: Normal rate, regular rhythm and normal heart sounds.  Pulmonary/Chest: Effort normal and breath sounds normal. No stridor. No respiratory distress. She  has no rales.  Abdominal: Soft. There is no tenderness. There is no rebound and no guarding.  Neurological: She is alert.  Skin: Skin is warm and dry.  Patient with scattered urticaria, at time of exam, most confluent on lower extremities, ankles and feet.  Psychiatric: She has a normal mood and affect.  Nursing note and vitals reviewed.    ED Treatments / Results  Labs (all labs ordered are listed, but only abnormal results are displayed) Labs Reviewed - No data to display  EKG None  Radiology No results found.  Procedures Procedures (including critical care  time)  Medications Ordered in ED Medications  dexamethasone (DECADRON) injection 10 mg (has no administration in time range)     Initial Impression / Assessment and Plan / ED Course  I have reviewed the triage vital signs and the nursing notes.  Pertinent labs & imaging results that were available during my care of the patient were reviewed by me and considered in my medical decision making (see chart for details).     Patient seen and examined.   Vital signs reviewed and are as follows: BP (!) 145/89   Pulse 88   Temp 98.4 F (36.9 C) (Oral)   Resp 16   Ht 4\' 10"  (1.473 m)   Wt 61.2 kg (135 lb)   SpO2 100%   BMI 28.22 kg/m   She is treating herself at home appropriately with histamine blockade.  We will add steroid.  Will give dose of IM dexamethasone here and patient will hold oral medications until after her colonoscopy tomorrow.  She is getting routine screening colonoscopy.  Final Clinical Impressions(s) / ED Diagnoses   Final diagnoses:  Urticaria   Patient with 2 days of hives.  Unclear etiology.  No infection symptoms.  No signs of angioedema.  No new medications.  Symptoms are minor however bothersome and keeps patient up at night.  Will treat with systemic steroid course.  ED Discharge Orders        Ordered    predniSONE (DELTASONE) 20 MG tablet     01/31/18 1619       Renne Crigler, PA-C 01/31/18 1624    Melene Plan, DO 01/31/18 [phone]

## 2018-02-01 DIAGNOSIS — Z1211 Encounter for screening for malignant neoplasm of colon: Secondary | ICD-10-CM | POA: Diagnosis not present

## 2018-02-01 LAB — HM COLONOSCOPY

## 2018-02-01 MED FILL — predniSONE 20 MG TABS: 20 | 12 days supply | Qty: 20 | Fill #0

## 2018-02-13 DIAGNOSIS — R300 Dysuria: Secondary | ICD-10-CM | POA: Diagnosis not present

## 2018-02-13 DIAGNOSIS — N390 Urinary tract infection, site not specified: Secondary | ICD-10-CM | POA: Diagnosis not present

## 2018-03-10 MED FILL — VENLAFAXINE HCL ER 150 MG C: 150 | 90 days supply | Qty: 90 | Fill #0

## 2018-03-24 DIAGNOSIS — H04123 Dry eye syndrome of bilateral lacrimal glands: Secondary | ICD-10-CM | POA: Diagnosis not present

## 2018-04-10 DIAGNOSIS — J029 Acute pharyngitis, unspecified: Secondary | ICD-10-CM | POA: Diagnosis not present

## 2018-04-10 DIAGNOSIS — R509 Fever, unspecified: Secondary | ICD-10-CM | POA: Diagnosis not present

## 2018-04-18 DIAGNOSIS — J069 Acute upper respiratory infection, unspecified: Secondary | ICD-10-CM | POA: Diagnosis not present

## 2018-04-18 DIAGNOSIS — R05 Cough: Secondary | ICD-10-CM | POA: Diagnosis not present

## 2018-05-05 MED FILL — ESTRADIOL 2 MG TABLET: 2 | 90 days supply | Qty: 90 | Fill #1

## 2018-06-15 MED FILL — VENLAFAXINE HCL ER 150 MG C: 150 | 90 days supply | Qty: 90 | Fill #1

## 2018-08-04 DIAGNOSIS — Z23 Encounter for immunization: Secondary | ICD-10-CM | POA: Diagnosis not present

## 2018-08-09 MED FILL — ESTRADIOL 2 MG TABLET: 2 | 90 days supply | Qty: 90 | Fill #2

## 2018-08-10 DIAGNOSIS — M25551 Pain in right hip: Secondary | ICD-10-CM | POA: Diagnosis not present

## 2018-08-10 DIAGNOSIS — M545 Low back pain: Secondary | ICD-10-CM | POA: Diagnosis not present

## 2018-09-12 DIAGNOSIS — R300 Dysuria: Secondary | ICD-10-CM | POA: Diagnosis not present

## 2018-09-20 MED FILL — VENLAFAXINE HCL ER 150 MG C: 150 | 90 days supply | Qty: 90 | Fill #2

## 2018-11-09 MED FILL — ESTRADIOL 2 MG TABLET: 2 | 90 days supply | Qty: 90 | Fill #3

## 2018-12-06 ENCOUNTER — Encounter (HOSPITAL_BASED_OUTPATIENT_CLINIC_OR_DEPARTMENT_OTHER): Payer: Self-pay | Admitting: *Deleted

## 2018-12-06 ENCOUNTER — Other Ambulatory Visit: Payer: Self-pay

## 2018-12-06 ENCOUNTER — Emergency Department (HOSPITAL_BASED_OUTPATIENT_CLINIC_OR_DEPARTMENT_OTHER)
Admission: EM | Admit: 2018-12-06 | Discharge: 2018-12-06 | Disposition: A | Discharge status: Home / Self Care | Payer: BLUE CROSS/BLUE SHIELD | Attending: Emergency Medicine | Admitting: Emergency Medicine

## 2018-12-06 ENCOUNTER — Emergency Department (HOSPITAL_BASED_OUTPATIENT_CLINIC_OR_DEPARTMENT_OTHER): Payer: BLUE CROSS/BLUE SHIELD

## 2018-12-06 DIAGNOSIS — R1011 Right upper quadrant pain: Secondary | ICD-10-CM | POA: Insufficient documentation

## 2018-12-06 DIAGNOSIS — R1031 Right lower quadrant pain: Secondary | ICD-10-CM | POA: Diagnosis not present

## 2018-12-06 DIAGNOSIS — Z79899 Other long term (current) drug therapy: Secondary | ICD-10-CM | POA: Insufficient documentation

## 2018-12-06 DIAGNOSIS — R110 Nausea: Secondary | ICD-10-CM | POA: Diagnosis not present

## 2018-12-06 DIAGNOSIS — N200 Calculus of kidney: Secondary | ICD-10-CM | POA: Diagnosis not present

## 2018-12-06 LAB — COMPREHENSIVE METABOLIC PANEL
ALT: 35 U/L (ref 0–44)
ANION GAP: 9 (ref 5–15)
AST: 40 U/L (ref 15–41)
Albumin: 4.2 g/dL (ref 3.5–5.0)
Alkaline Phosphatase: 50 U/L (ref 38–126)
BILIRUBIN TOTAL: 0.5 mg/dL (ref 0.3–1.2)
BUN: 11 mg/dL (ref 6–20)
CALCIUM: 9.1 mg/dL (ref 8.9–10.3)
CO2: 24 mmol/L (ref 22–32)
Chloride: 106 mmol/L (ref 98–111)
Creatinine, Ser: 0.56 mg/dL (ref 0.44–1.00)
Glucose, Bld: 97 mg/dL (ref 70–99)
Potassium: 3.6 mmol/L (ref 3.5–5.1)
SODIUM: 139 mmol/L (ref 135–145)
TOTAL PROTEIN: 7.8 g/dL (ref 6.5–8.1)

## 2018-12-06 LAB — URINALYSIS, ROUTINE W REFLEX MICROSCOPIC
Bilirubin Urine: NEGATIVE
GLUCOSE, UA: NEGATIVE mg/dL
Hgb urine dipstick: NEGATIVE
Ketones, ur: NEGATIVE mg/dL
LEUKOCYTE UA: NEGATIVE
Nitrite: NEGATIVE
PROTEIN: NEGATIVE mg/dL
Specific Gravity, Urine: <1.005 — ABNORMAL LOW (ref 1.005–1.030)
pH: 7 (ref 5.0–8.0)

## 2018-12-06 LAB — CBC WITH DIFFERENTIAL/PLATELET
ABS IMMATURE GRANULOCYTES: 0.02 10*3/uL (ref 0.00–0.07)
BASOS ABS: 0 10*3/uL (ref 0.0–0.1)
BASOS PCT: 0 %
Eosinophils Absolute: 0.2 10*3/uL (ref 0.0–0.5)
Eosinophils Relative: 2 %
HCT: 38.4 % (ref 36.0–46.0)
Hemoglobin: 11.8 g/dL — ABNORMAL LOW (ref 12.0–15.0)
IMMATURE GRANULOCYTES: 0 %
Lymphocytes Relative: 21 %
Lymphs Abs: 1.5 10*3/uL (ref 0.7–4.0)
MCH: 26 pg (ref 26.0–34.0)
MCHC: 30.7 g/dL (ref 30.0–36.0)
MCV: 84.6 fL (ref 80.0–100.0)
MONOS PCT: 8 %
Monocytes Absolute: 0.5 10*3/uL (ref 0.1–1.0)
NEUTROS ABS: 4.8 10*3/uL (ref 1.7–7.7)
NEUTROS PCT: 69 %
Platelets: 350 10*3/uL (ref 150–400)
RBC: 4.54 MIL/uL (ref 3.87–5.11)
RDW: 13.6 % (ref 11.5–15.5)
WBC: 7 10*3/uL (ref 4.0–10.5)
nRBC: 0 % (ref 0.0–0.2)

## 2018-12-06 LAB — LIPASE, BLOOD: LIPASE: 36 U/L (ref 11–51)

## 2018-12-06 MED ORDER — IBUPROFEN 400 MG PO TABS
400.0000 mg | ORAL_TABLET | Freq: Once | ORAL | Status: AC
  Administered 2018-12-06: 400 mg via ORAL
  Filled 2018-12-06: qty 1

## 2018-12-06 NOTE — ED Triage Notes (Signed)
C/o rt flank pain constant, sharp radiating to abd x 2 hours  Denies n/v/d   Denies ua sx  No vaginal dc

## 2018-12-06 NOTE — ED Provider Notes (Signed)
[facility] EMERGENCY DEPARTMENT Provider Note   CSN: [id number] Arrival date & time: 12/06/18  0846    History   Chief Complaint Chief Complaint  Patient presents with  . Abdominal Pain    HPI [name] is a 53 y.o. female.     The history is provided by the patient.  Abdominal Pain  Pain location:  RLQ, RUQ and R flank Pain quality: shooting, squeezing and stabbing   Pain radiates to:  R flank and R shoulder Pain severity:  Severe Onset quality:  Sudden Duration:  2 hours Timing:  Constant Progression:  Unchanged Chronicity:  New Context comment:  Completely fine this morning and then around 7:00 the pain started suddenly. Relieved by:  Nothing Worsened by:  Nothing Ineffective treatments:  None tried Associated symptoms: nausea   Associated symptoms: no anorexia, no chest pain, no constipation, no cough, no diarrhea, no fever, no shortness of breath and no vomiting   Risk factors comment:  History of C-section but no other abdominal surgeries.  Otherwise healthy does not take medication regularly.   Past Medical History:  Diagnosis Date  . Anxiety     There are no active problems to display for this patient.   Past Surgical History:  Procedure Laterality Date  . ABDOMINAL HYSTERECTOMY       OB History   No obstetric history on file.      Home Medications    Prior to Admission medications   Medication Sig Start Date End Date Taking? Authorizing Provider  ciprofloxacin (CIPRO) 500 MG tablet Take 1 tablet (500 mg total) by mouth 2 (two) times daily. 10/30/16   Hedges, Tinnie Gens, PA-C  ondansetron (ZOFRAN) 4 MG tablet Take 1 tablet (4 mg total) by mouth every 6 (six) hours. 10/30/16   Hedges, Tinnie Gens, PA-C  predniSONE (DELTASONE) 20 MG tablet 3 Tabs PO Days 1-3, then 2 tabs PO Days 4-6, then 1 tab PO Day 7-9, then Half Tab PO Day 10-12 01/31/18   Renne Crigler, PA-C  venlafaxine (EFFEXOR) 75 MG tablet Take 75 mg by mouth 2 (two) times  daily.    [provider]    Family History No family history on file.  Social History Social History   Tobacco Use  . Smoking status: Never Smoker  . Smokeless tobacco: Never Used  Substance Use Topics  . Alcohol use: Yes    Comment: occ  . Drug use: No     Allergies   Ciprofloxacin; Macrobid [nitrofurantoin macrocrystal]; and Amoxicillin   Review of Systems Review of Systems  Constitutional: Negative for fever.  Respiratory: Negative for cough and shortness of breath.   Cardiovascular: Negative for chest pain.  Gastrointestinal: Positive for abdominal pain and nausea. Negative for anorexia, constipation, diarrhea and vomiting.  All other systems reviewed and are negative.    Physical Exam Updated Vital Signs BP (!) 150/76 (BP Location: Right Arm)   Pulse 64   Temp 97.7 F (36.5 C) (Oral)   Resp 18   SpO2 100%   Physical Exam Vitals signs and nursing note reviewed.  Constitutional:      General: She is not in acute distress.    Appearance: She is well-developed.  HENT:     Head: Normocephalic and atraumatic.     Mouth/Throat:     Mouth: Mucous membranes are moist.  Eyes:     Pupils: Pupils are equal, round, and reactive to light.  Cardiovascular:     Rate and Rhythm: Normal rate  and regular rhythm.     Heart sounds: Normal heart sounds. No murmur. No friction rub.  Pulmonary:     Effort: Pulmonary effort is normal.     Breath sounds: Normal breath sounds. No wheezing or rales.  Abdominal:     General: Bowel sounds are normal. There is no distension.     Palpations: Abdomen is soft.     Tenderness: There is abdominal tenderness in the right upper quadrant and right lower quadrant. There is no right CVA tenderness, guarding or rebound. Negative signs include Murphy's sign.  Musculoskeletal: Normal range of motion.        General: No tenderness.     Comments: No edema  Skin:    General: Skin is warm and dry.     Findings: No rash.    Neurological:     General: No focal deficit present.     Mental Status: She is alert and oriented to person, place, and time. Mental status is at baseline.     Cranial Nerves: No cranial nerve deficit.  Psychiatric:        Mood and Affect: Mood normal.        Behavior: Behavior normal.      ED Treatments / Results  Labs (all labs ordered are listed, but only abnormal results are displayed) Labs Reviewed  URINALYSIS, ROUTINE W REFLEX MICROSCOPIC - Abnormal; Notable for the following components:      Result Value   Specific Gravity, Urine <1.005 (*)    All other components within normal limits  CBC WITH DIFFERENTIAL/PLATELET - Abnormal; Notable for the following components:   Hemoglobin 11.8 (*)    All other components within normal limits  COMPREHENSIVE METABOLIC PANEL  LIPASE, BLOOD  CBC WITH DIFFERENTIAL/PLATELET    EKG None  Radiology Ct Renal Stone Study  Result Date: 12/06/2018 CLINICAL DATA:  Right flank pain for the past few hours. EXAM: CT ABDOMEN AND PELVIS WITHOUT CONTRAST TECHNIQUE: Multidetector CT imaging of the abdomen and pelvis was performed following the standard protocol without IV contrast. COMPARISON:  None. FINDINGS: Lower chest: Minimal patchy tree-in-bud type findings bilaterally with small airspace nodules likely an inflammatory process. No focal infiltrates. No pleural effusion or pulmonary lesions. The heart is normal in size. No pericardial effusion. The distal esophagus and aorta are unremarkable. Hepatobiliary: No focal hepatic lesions are identified without contrast. No intrahepatic biliary dilatation. The gallbladder is normal. No common bile duct dilatation. Pancreas: No mass, inflammation or ductal dilatation without contrast. Spleen: Normal size.  No focal lesions. Adrenals/Urinary Tract: Adrenal the adrenal glands are normal. Very small right renal calculi are noted. No hydroureteronephrosis or obstructing ureteral calculi. No left-sided  hydroureteronephrosis. No worrisome renal lesions without contrast. No obvious bladder lesions. Stomach/Bowel: The stomach, duodenum, small bowel and colon are grossly normal without oral contrast. No inflammatory changes, mass lesions or obstructive findings. The terminal ileum and appendix are normal. Vascular/Lymphatic: The aorta is normal in caliber. Scattered atheroscerlotic calcifications. No mesenteric of retroperitoneal mass or adenopathy. Small scattered lymph nodes are noted. Reproductive: Surgically absent. Other: No pelvic mass or adenopathy. No free pelvic fluid collections. No inguinal mass or adenopathy. No abdominal wall hernia or subcutaneous lesions. Musculoskeletal: No significant bony findings. 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 IMPRESSION: 1. Small right renal calculi but no obstructing ureteral calculi or bladder calculi. 2. No acute abdominal/pelvic findings, mass lesions or adenopathy. 3. Mild inflammatory changes at both lung bases but no infiltrates or effusions. Electronically Signed   By:  Rudie Meyer M.D.   On: 12/06/2018 11:46    Procedures Procedures (including critical care time)  Medications Ordered in ED Medications  ibuprofen (ADVIL,MOTRIN) tablet 400 mg (400 mg Oral Given 12/06/18 1000)     Initial Impression / Assessment and Plan / ED Course  I have reviewed the triage vital signs and the nursing notes.  Pertinent labs & imaging results that were available during my care of the patient were reviewed by me and considered in my medical decision making (see chart for details).        Pt with symptoms consistent with kidney stone.  Denies infectious sx, or GI symptoms.  Low concern for diverticulitis and no risk factors or history suggestive of AAA.  No hx suggestive of GU source (discharge) and otherwise pt is healthy.  Will  treat pain and ensure no infection with UA, CBC, CMP and will get stone study to further eval.  12:37 PM Labs are  reassuring without acute findings.  CT showed a small renal calculi in the right kidney but no obstructing stones now.  Patient continues to have minimal pain and question whether she passed a stone prior to arrival.  He does have mild inflammatory changes at both lung bases but no infiltrates and she denies any URI symptoms. Final Clinical Impressions(s) / ED Diagnoses   Final diagnoses:  Right lower quadrant abdominal pain    ED Discharge Orders    None       Gwyneth Sprout, MD 12/06/18 1238

## 2018-12-21 MED FILL — VENLAFAXINE HCL ER 150 MG C: 150 | 90 days supply | Qty: 90 | Fill #3

## 2019-02-14 DIAGNOSIS — M7918 Myalgia, other site: Secondary | ICD-10-CM | POA: Diagnosis not present

## 2019-02-14 MED FILL — ESTRADIOL 2 MG TABLET: 2 | 90 days supply | Qty: 90 | Fill #0

## 2019-03-01 IMAGING — CT CT RENAL STONE PROTOCOL
2 of 4 series · 16 of 46 positions shown, 18 images · non-contrast
Comparison: None.

CLINICAL DATA: Right flank pain for the past few hours.

EXAM:
CT ABDOMEN AND PELVIS WITHOUT CONTRAST
TECHNIQUE: Multidetector CT imaging of the abdomen and pelvis was performed
following the standard protocol without IV contrast.

[Series 2: axial st · axial · 0.81mm/px · z∈[-413,-28]mm · 13 of 85 slices shown, 15 images]
[im 4/85  soft-tissue]
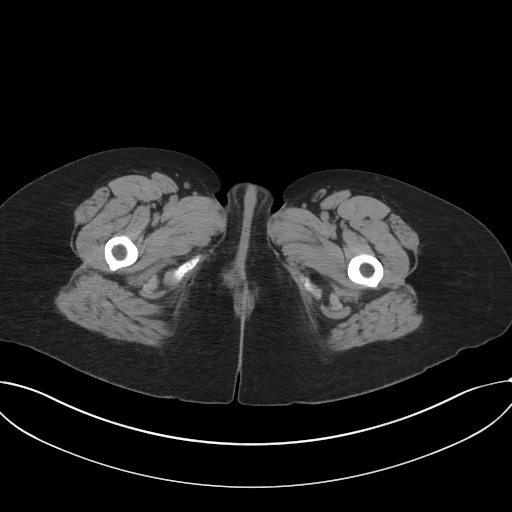
[im 4/85  bone]
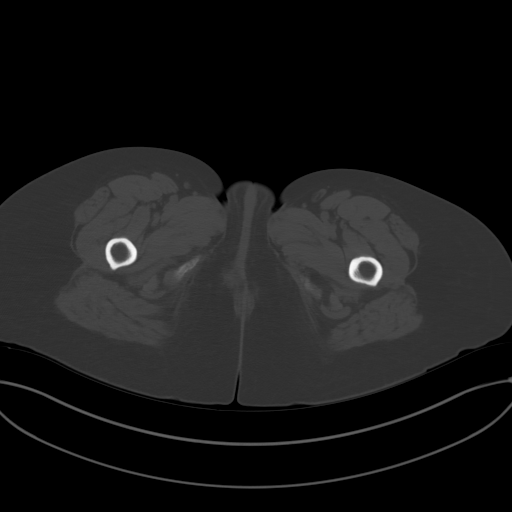
[im 11/85  soft-tissue]
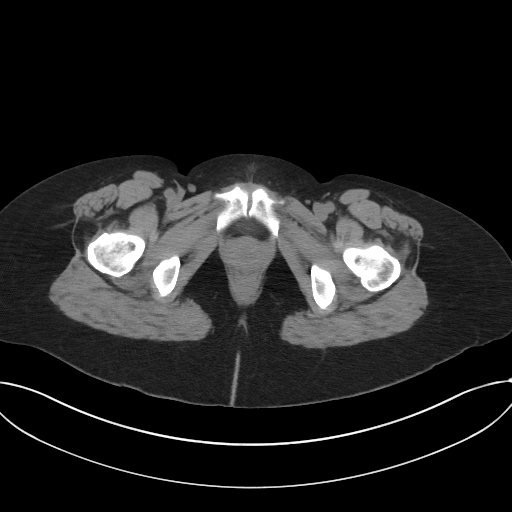
[im 19/85  soft-tissue]
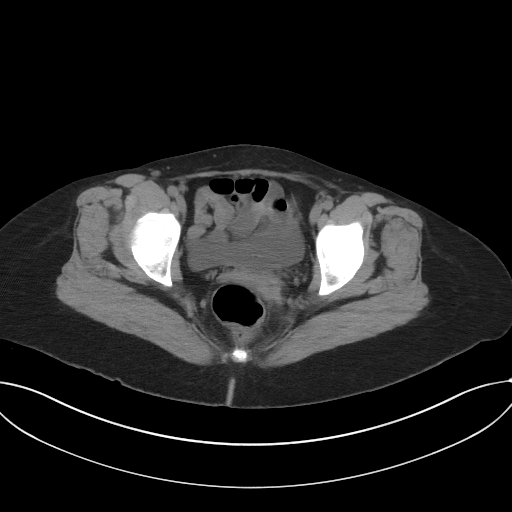
[im 22/85  soft-tissue]
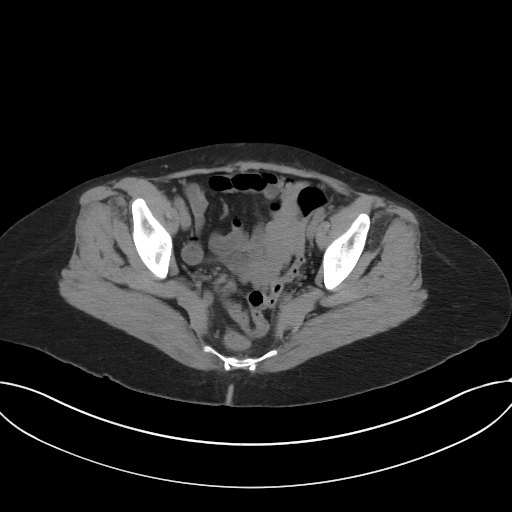
[im 30/85  soft-tissue]
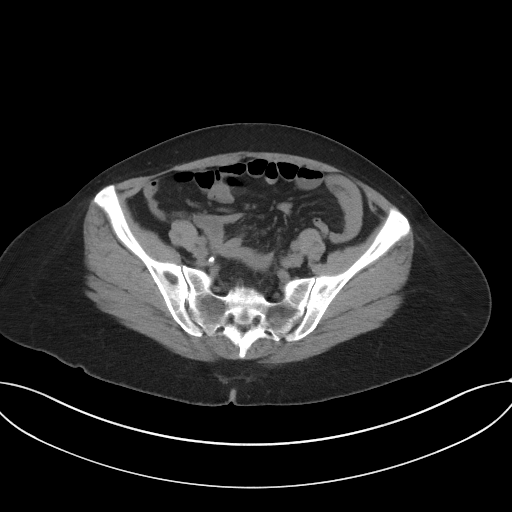
[im 37/85  soft-tissue]
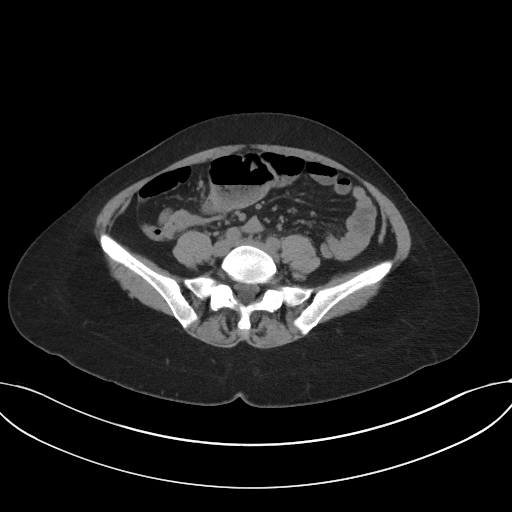
[im 44/85  soft-tissue]
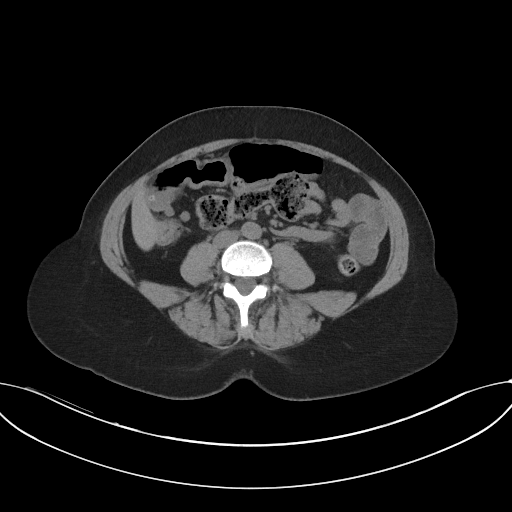
[im 48/85  soft-tissue]
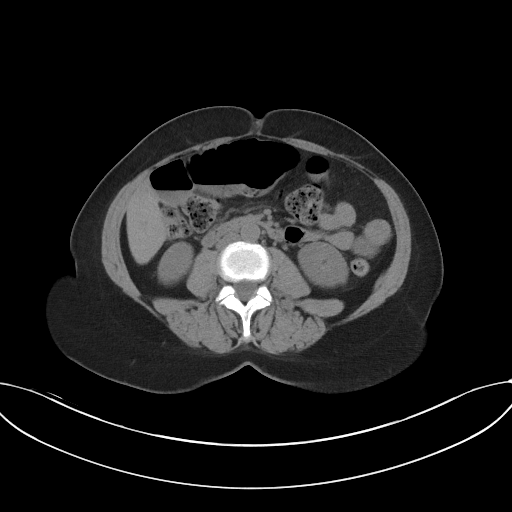
[im 55/85  soft-tissue]
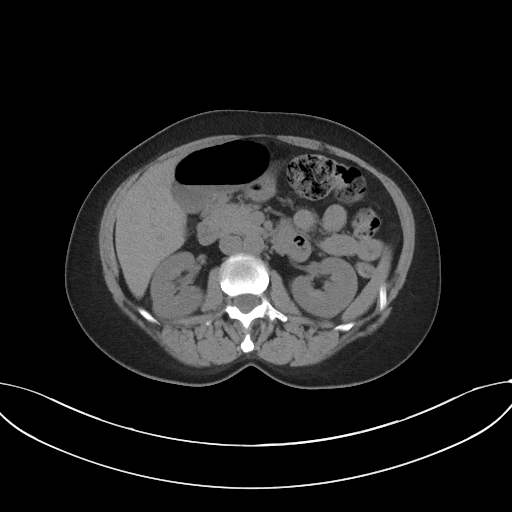
[im 55/85  bone]
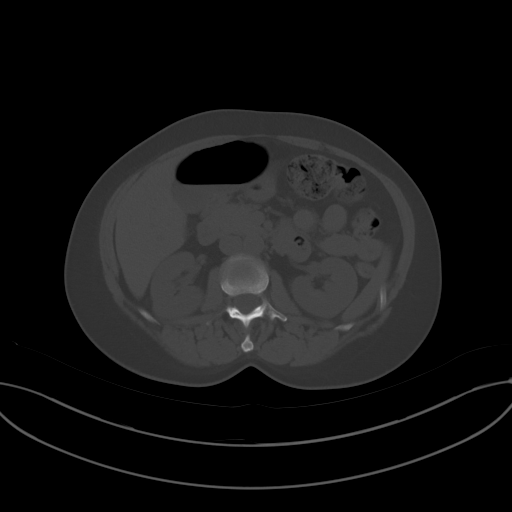
[im 63/85  soft-tissue]
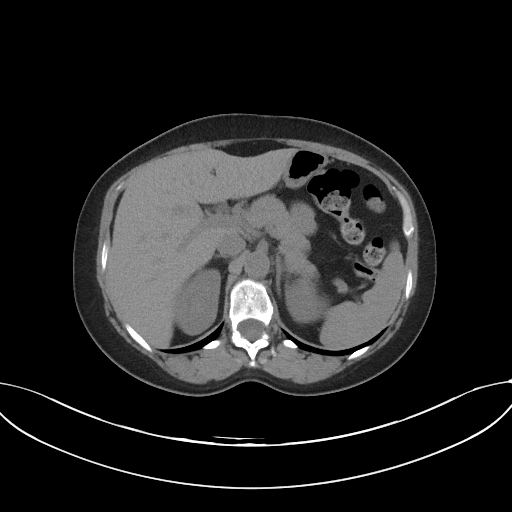
[im 66/85  soft-tissue]
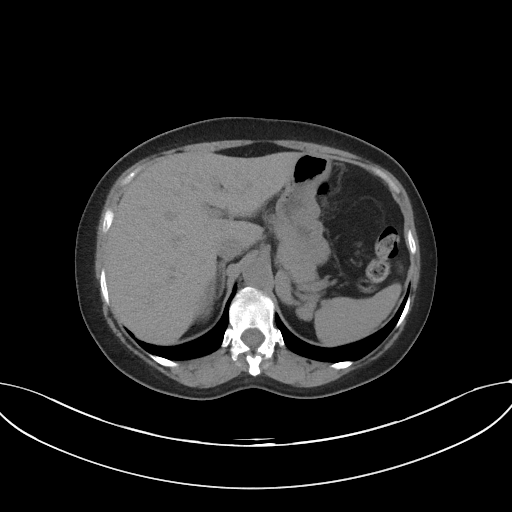
[im 74/85  soft-tissue]
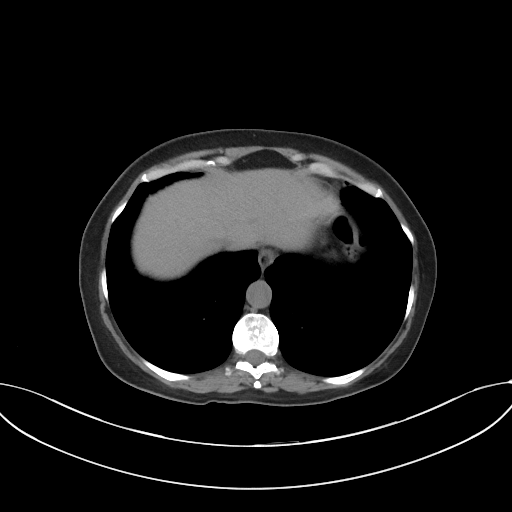
[im 81/85  soft-tissue]
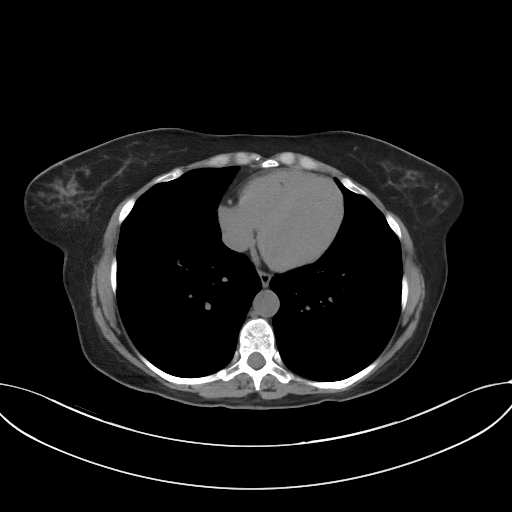

[Series 5: coronal st · coronal · 0.76mm/px · 3 of 84 slices shown]
[im 28/84  soft-tissue]
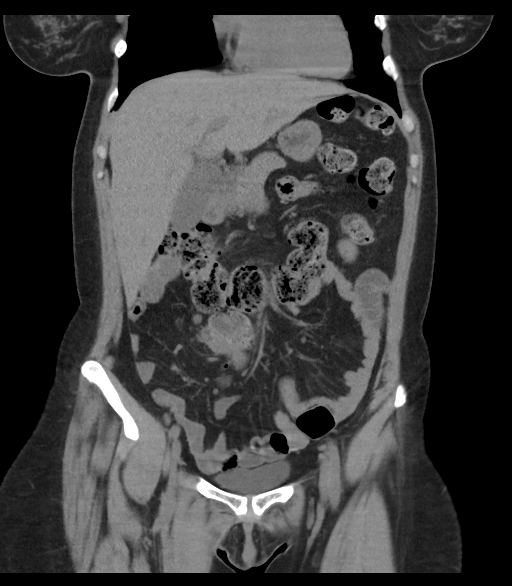
[im 37/84  soft-tissue]
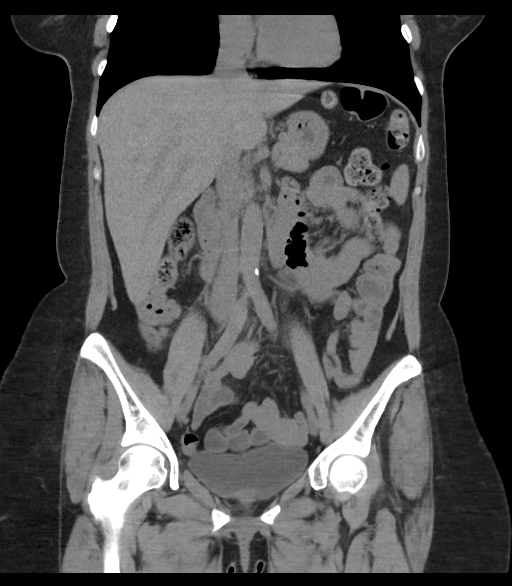
[im 47/84  soft-tissue]
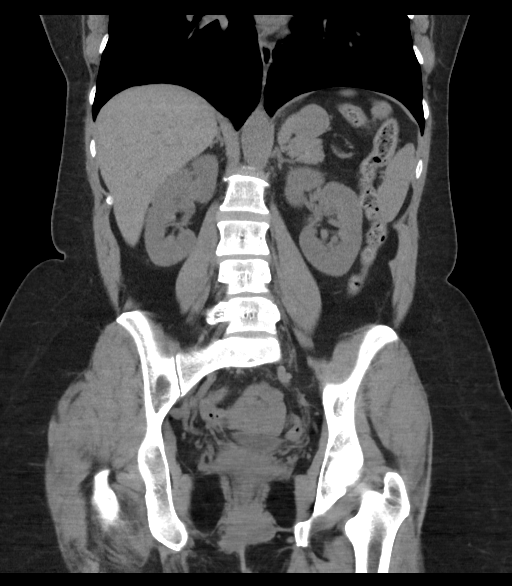

[16 of 46 positions shown; findings below may reference images not displayed]

FINDINGS: Lower chest: Minimal patchy tree-in-bud type findings bilaterally
with small airspace nodules likely an inflammatory process. No focal
infiltrates. No pleural effusion or pulmonary lesions. The heart is
normal in size. No pericardial effusion. The distal esophagus and
aorta are unremarkable.

Hepatobiliary: No focal hepatic lesions are identified without
contrast. No intrahepatic biliary dilatation. The gallbladder is
normal. No common bile duct dilatation.

Pancreas: No mass, inflammation or ductal dilatation without
contrast.

Spleen: Normal size.  No focal lesions.

Adrenals/Urinary Tract: Adrenal the adrenal glands are normal.

Very small right renal calculi are noted. No hydroureteronephrosis
or obstructing ureteral calculi. No left-sided
hydroureteronephrosis.

No worrisome renal lesions without contrast. No obvious bladder
lesions.

Stomach/Bowel: The stomach, duodenum, small bowel and colon are
grossly normal without oral contrast. No inflammatory changes, mass
lesions or obstructive findings. The terminal ileum and appendix are
normal.

Vascular/Lymphatic: The aorta is normal in caliber. Scattered
atheroscerlotic calcifications. No mesenteric of retroperitoneal
mass or adenopathy. Small scattered lymph nodes are noted.

Reproductive: Surgically absent.

Other: No pelvic mass or adenopathy. No free pelvic fluid
collections. No inguinal mass or adenopathy. No abdominal wall
hernia or subcutaneous lesions.

Musculoskeletal: No significant bony findings. Congenital fusion at
T10-11 (Kerry anomaly).
IMPRESSION: 1. Small right renal calculi but no obstructing ureteral calculi or
bladder calculi.
2. No acute abdominal/pelvic findings, mass lesions or adenopathy.
3. Mild inflammatory changes at both lung bases but no infiltrates
or effusions.

## 2019-03-15 MED FILL — ALPRAZolam 0.25 MG TABS: 0.25 | 10 days supply | Qty: 30 | Fill #0

## 2019-03-15 MED FILL — VENLAFAXINE HCL ER 150 MG C: 150 | 90 days supply | Qty: 90 | Fill #0

## 2019-05-16 MED FILL — ESTRADIOL 2 MG TABLET: 2 | 90 days supply | Qty: 90 | Fill #0

## 2019-06-21 MED FILL — VENLAFAXINE HCL ER 150 MG C: 150 | 90 days supply | Qty: 90 | Fill #0

## 2019-06-29 MED FILL — VENLAFAXINE HCL ER 150 MG C: 150 | 90 days supply | Qty: 90 | Fill #0

## 2019-07-04 DIAGNOSIS — R300 Dysuria: Secondary | ICD-10-CM | POA: Diagnosis not present

## 2019-08-15 MED FILL — ESTRADIOL 2 MG TABLET: 2 | 90 days supply | Qty: 90 | Fill #1

## 2019-08-22 DIAGNOSIS — F419 Anxiety disorder, unspecified: Secondary | ICD-10-CM | POA: Diagnosis not present

## 2019-08-22 DIAGNOSIS — N959 Unspecified menopausal and perimenopausal disorder: Secondary | ICD-10-CM | POA: Diagnosis not present

## 2019-08-22 DIAGNOSIS — Z01419 Encounter for gynecological examination (general) (routine) without abnormal findings: Secondary | ICD-10-CM | POA: Diagnosis not present

## 2019-08-22 DIAGNOSIS — Z1231 Encounter for screening mammogram for malignant neoplasm of breast: Secondary | ICD-10-CM | POA: Diagnosis not present

## 2019-08-22 DIAGNOSIS — Z6829 Body mass index (BMI) 29.0-29.9, adult: Secondary | ICD-10-CM | POA: Diagnosis not present

## 2019-08-22 MED FILL — ALPRAZolam 0.25 MG TABS: 0.25 | 10 days supply | Qty: 30 | Fill #0

## 2019-09-05 DIAGNOSIS — M549 Dorsalgia, unspecified: Secondary | ICD-10-CM | POA: Diagnosis not present

## 2019-09-05 DIAGNOSIS — M62830 Muscle spasm of back: Secondary | ICD-10-CM | POA: Diagnosis not present

## 2019-09-26 MED FILL — VENLAFAXINE HCL ER 150 MG C: 150 | 90 days supply | Qty: 90 | Fill #0

## 2019-11-21 MED FILL — ESTRADIOL 2 MG TABS: 2 | 90 days supply | Qty: 90 | Fill #2

## 2019-12-26 MED FILL — VENLAFAXINE HCL ER 150 MG C: 150 | 90 days supply | Qty: 90 | Fill #1

## 2019-12-31 DIAGNOSIS — Z03818 Encounter for observation for suspected exposure to other biological agents ruled out: Secondary | ICD-10-CM | POA: Diagnosis not present

## 2019-12-31 DIAGNOSIS — Z20828 Contact with and (suspected) exposure to other viral communicable diseases: Secondary | ICD-10-CM | POA: Diagnosis not present

## 2020-01-06 ENCOUNTER — Ambulatory Visit: Payer: BC Managed Care – PPO | Attending: Internal Medicine

## 2020-01-06 DIAGNOSIS — Z23 Encounter for immunization: Secondary | ICD-10-CM

## 2020-01-06 NOTE — Progress Notes (Signed)
   Covid-19 Vaccination Clinic  Name:  [name]    MRN: [id number] DOB: 1966-08-04  01/06/2020  [name] was observed post Covid-19 immunization for 15 minutes without incident. She was provided with Vaccine Information Sheet and instruction to access the V-Safe system.   [name] was instructed to call 911 with any severe reactions post vaccine: Marland Kitchen Difficulty breathing  . Swelling of face and throat  . A fast heartbeat  . A bad rash all over body  . Dizziness and weakness   Immunizations Administered    Name Date Dose VIS Date Route   Pfizer COVID-19 Vaccine 01/06/2020  8:49 AM 0.3 mL 09/23/2019 Intramuscular   Manufacturer: [employer], [employer]   Lot: [id number]   NDC: [address]-9

## 2020-01-18 MED FILL — ALPRAZolam 0.25 MG TABS: 0.25 | 10 days supply | Qty: 30 | Fill #0

## 2020-01-31 ENCOUNTER — Ambulatory Visit: Payer: BC Managed Care – PPO | Attending: Internal Medicine

## 2020-01-31 DIAGNOSIS — Z23 Encounter for immunization: Secondary | ICD-10-CM

## 2020-01-31 NOTE — Progress Notes (Signed)
   Covid-19 Vaccination Clinic  Name:  [name]    MRN: [id number] DOB: October 01, 1966  01/31/2020  [name] was observed post Covid-19 immunization for 15 minutes without incident. She was provided with Vaccine Information Sheet and instruction to access the V-Safe system.   [name] was instructed to call 911 with any severe reactions post vaccine: Marland Kitchen Difficulty breathing  . Swelling of face and throat  . A fast heartbeat  . A bad rash all over body  . Dizziness and weakness   Immunizations Administered    Name Date Dose VIS Date Route   Pfizer COVID-19 Vaccine 01/31/2020  8:36 AM 0.3 mL 12/07/2018 Intramuscular   Manufacturer: [employer], [employer]   Lot: [id number]   NDC: [address]-1

## 2020-04-02 MED FILL — VENLAFAXINE HCL ER 150 MG C: 150 | 90 days supply | Qty: 90 | Fill #2

## 2020-05-08 DIAGNOSIS — H16223 Keratoconjunctivitis sicca, not specified as Sjogren's, bilateral: Secondary | ICD-10-CM | POA: Diagnosis not present

## 2020-05-30 MED FILL — ESTRADIOL 2 MG TABS: 2 | 90 days supply | Qty: 90 | Fill #1

## 2020-05-31 DIAGNOSIS — R300 Dysuria: Secondary | ICD-10-CM | POA: Diagnosis not present

## 2020-07-02 MED FILL — VENLAFAXINE HCL ER 150 MG C: 150 | 90 days supply | Qty: 90 | Fill #3

## 2020-08-29 ENCOUNTER — Other Ambulatory Visit (HOSPITAL_COMMUNITY): Payer: Self-pay | Admitting: Obstetrics and Gynecology

## 2020-08-30 MED FILL — ALPRAZolam 0.25 MG TABS: 0.25 | 10 days supply | Qty: 30 | Fill #0

## 2020-08-30 MED FILL — ESTRADIOL 2 MG TABS: 2 | 90 days supply | Qty: 90 | Fill #0

## 2020-09-28 ENCOUNTER — Other Ambulatory Visit (HOSPITAL_COMMUNITY): Payer: Self-pay | Admitting: Obstetrics and Gynecology

## 2020-09-28 MED FILL — VENLAFAXINE HCL ER 150 MG C: 150 | 90 days supply | Qty: 90 | Fill #0

## 2020-11-28 MED FILL — ESTRADIOL 2 MG TABS: 2 | 90 days supply | Qty: 90 | Fill #1

## 2020-12-31 ENCOUNTER — Other Ambulatory Visit (HOSPITAL_COMMUNITY): Payer: Self-pay | Admitting: Obstetrics and Gynecology

## 2020-12-31 MED FILL — VENLAFAXINE HCL ER 150 MG C: 150 | 90 days supply | Qty: 90 | Fill #0

## 2021-03-05 ENCOUNTER — Other Ambulatory Visit (HOSPITAL_COMMUNITY): Payer: Self-pay

## 2021-03-05 MED FILL — Estradiol Tab 2 MG: ORAL | 90 days supply | Qty: 90 | Fill #0 | Status: AC

## 2021-03-26 ENCOUNTER — Other Ambulatory Visit (HOSPITAL_COMMUNITY): Payer: Self-pay

## 2021-03-26 DIAGNOSIS — Z13820 Encounter for screening for osteoporosis: Secondary | ICD-10-CM | POA: Diagnosis not present

## 2021-03-26 DIAGNOSIS — Z6832 Body mass index (BMI) 32.0-32.9, adult: Secondary | ICD-10-CM | POA: Diagnosis not present

## 2021-03-26 DIAGNOSIS — Z01419 Encounter for gynecological examination (general) (routine) without abnormal findings: Secondary | ICD-10-CM | POA: Diagnosis not present

## 2021-03-26 DIAGNOSIS — Z1231 Encounter for screening mammogram for malignant neoplasm of breast: Secondary | ICD-10-CM | POA: Diagnosis not present

## 2021-03-26 MED ORDER — VENLAFAXINE HCL ER 150 MG PO CP24
150.0000 mg | ORAL_CAPSULE | Freq: Every morning | ORAL | 12 refills | Status: DC
  Filled 2021-03-26: qty 30, 30d supply, fill #0
  Filled 2021-04-30: qty 30, 30d supply, fill #1
  Filled 2021-06-01: qty 30, 30d supply, fill #2
  Filled 2021-07-01: qty 30, 30d supply, fill #3
  Filled 2021-07-28: qty 30, 30d supply, fill #4
  Filled 2021-08-28: qty 30, 30d supply, fill #5
  Filled 2021-09-29: qty 30, 30d supply, fill #6
  Filled 2021-10-29: qty 30, 30d supply, fill #7
  Filled 2021-11-26: qty 30, 30d supply, fill #8
  Filled 2021-12-27: qty 30, 30d supply, fill #9
  Filled 2022-01-27: qty 30, 30d supply, fill #10
  Filled 2022-02-25: qty 30, 30d supply, fill #11
  Filled 2022-03-26: qty 30, 30d supply, fill #12

## 2021-03-26 MED ORDER — ALPRAZOLAM 0.25 MG PO TABS
0.2500 mg | ORAL_TABLET | Freq: Three times a day (TID) | ORAL | 0 refills | Status: DC | PRN
  Filled 2021-03-26: qty 30, 10d supply, fill #0

## 2021-03-26 MED ORDER — ESTRADIOL 2 MG PO TABS
2.0000 mg | ORAL_TABLET | Freq: Every day | ORAL | 12 refills | Status: DC
  Filled 2021-03-26: qty 30, 30d supply, fill #0

## 2021-03-28 ENCOUNTER — Other Ambulatory Visit (HOSPITAL_COMMUNITY): Payer: Self-pay

## 2021-03-28 MED ORDER — VENLAFAXINE HCL ER 150 MG PO CP24
150.0000 mg | ORAL_CAPSULE | Freq: Every morning | ORAL | 4 refills | Status: DC
  Filled 2021-03-28: qty 90, 90d supply, fill #0

## 2021-04-30 ENCOUNTER — Other Ambulatory Visit (HOSPITAL_COMMUNITY): Payer: Self-pay

## 2021-06-01 MED FILL — Estradiol Tab 2 MG: ORAL | 90 days supply | Qty: 90 | Fill #1 | Status: AC

## 2021-06-03 ENCOUNTER — Other Ambulatory Visit (HOSPITAL_COMMUNITY): Payer: Self-pay

## 2021-07-01 ENCOUNTER — Other Ambulatory Visit (HOSPITAL_COMMUNITY): Payer: Self-pay

## 2021-07-08 DIAGNOSIS — I10 Essential (primary) hypertension: Secondary | ICD-10-CM | POA: Diagnosis not present

## 2021-07-25 DIAGNOSIS — I10 Essential (primary) hypertension: Secondary | ICD-10-CM | POA: Diagnosis not present

## 2021-07-25 DIAGNOSIS — R8290 Unspecified abnormal findings in urine: Secondary | ICD-10-CM | POA: Diagnosis not present

## 2021-07-25 DIAGNOSIS — R739 Hyperglycemia, unspecified: Secondary | ICD-10-CM | POA: Diagnosis not present

## 2021-07-29 ENCOUNTER — Other Ambulatory Visit (HOSPITAL_COMMUNITY): Payer: Self-pay

## 2021-08-20 ENCOUNTER — Other Ambulatory Visit: Payer: Self-pay

## 2021-08-28 ENCOUNTER — Other Ambulatory Visit (HOSPITAL_COMMUNITY): Payer: Self-pay

## 2021-08-28 MED FILL — Estradiol Tab 2 MG: ORAL | 90 days supply | Qty: 90 | Fill #2 | Status: AC

## 2021-09-30 ENCOUNTER — Other Ambulatory Visit (HOSPITAL_COMMUNITY): Payer: Self-pay

## 2021-10-29 ENCOUNTER — Other Ambulatory Visit (HOSPITAL_COMMUNITY): Payer: Self-pay

## 2021-10-31 DIAGNOSIS — R109 Unspecified abdominal pain: Secondary | ICD-10-CM | POA: Diagnosis not present

## 2021-11-27 ENCOUNTER — Other Ambulatory Visit (HOSPITAL_COMMUNITY): Payer: Self-pay

## 2021-11-28 ENCOUNTER — Other Ambulatory Visit (HOSPITAL_COMMUNITY): Payer: Self-pay

## 2021-11-28 MED ORDER — ESTRADIOL 2 MG PO TABS
2.0000 mg | ORAL_TABLET | Freq: Every day | ORAL | 3 refills | Status: DC
  Filled 2021-11-28: qty 90, 90d supply, fill #0
  Filled 2022-03-03: qty 90, 90d supply, fill #1
  Filled 2022-05-26: qty 90, 90d supply, fill #2
  Filled 2022-08-31: qty 90, 90d supply, fill #3

## 2021-12-27 ENCOUNTER — Other Ambulatory Visit (HOSPITAL_COMMUNITY): Payer: Self-pay

## 2022-01-11 DIAGNOSIS — Z03818 Encounter for observation for suspected exposure to other biological agents ruled out: Secondary | ICD-10-CM | POA: Diagnosis not present

## 2022-01-11 DIAGNOSIS — J029 Acute pharyngitis, unspecified: Secondary | ICD-10-CM | POA: Diagnosis not present

## 2022-01-11 DIAGNOSIS — B349 Viral infection, unspecified: Secondary | ICD-10-CM | POA: Diagnosis not present

## 2022-01-15 DIAGNOSIS — R059 Cough, unspecified: Secondary | ICD-10-CM | POA: Diagnosis not present

## 2022-01-15 DIAGNOSIS — J019 Acute sinusitis, unspecified: Secondary | ICD-10-CM | POA: Diagnosis not present

## 2022-01-15 DIAGNOSIS — B349 Viral infection, unspecified: Secondary | ICD-10-CM | POA: Diagnosis not present

## 2022-01-27 ENCOUNTER — Other Ambulatory Visit (HOSPITAL_COMMUNITY): Payer: Self-pay

## 2022-02-03 DIAGNOSIS — S5011XA Contusion of right forearm, initial encounter: Secondary | ICD-10-CM | POA: Diagnosis not present

## 2022-02-25 ENCOUNTER — Other Ambulatory Visit (HOSPITAL_COMMUNITY): Payer: Self-pay

## 2022-03-03 ENCOUNTER — Other Ambulatory Visit (HOSPITAL_COMMUNITY): Payer: Self-pay

## 2022-03-26 ENCOUNTER — Other Ambulatory Visit (HOSPITAL_COMMUNITY): Payer: Self-pay

## 2022-03-31 DIAGNOSIS — G4719 Other hypersomnia: Secondary | ICD-10-CM | POA: Diagnosis not present

## 2022-04-01 DIAGNOSIS — G4719 Other hypersomnia: Secondary | ICD-10-CM | POA: Diagnosis not present

## 2022-04-28 ENCOUNTER — Other Ambulatory Visit (HOSPITAL_COMMUNITY): Payer: Self-pay

## 2022-04-28 MED ORDER — VENLAFAXINE HCL ER 150 MG PO CP24
150.0000 mg | ORAL_CAPSULE | Freq: Every morning | ORAL | 12 refills | Status: DC
  Filled 2022-04-28: qty 30, 30d supply, fill #0
  Filled 2022-05-26: qty 30, 30d supply, fill #1

## 2022-04-30 ENCOUNTER — Other Ambulatory Visit (HOSPITAL_COMMUNITY): Payer: Self-pay

## 2022-05-26 ENCOUNTER — Other Ambulatory Visit (HOSPITAL_COMMUNITY): Payer: Self-pay

## 2022-06-25 DIAGNOSIS — Z1231 Encounter for screening mammogram for malignant neoplasm of breast: Secondary | ICD-10-CM | POA: Diagnosis not present

## 2022-06-25 DIAGNOSIS — Z6833 Body mass index (BMI) 33.0-33.9, adult: Secondary | ICD-10-CM | POA: Diagnosis not present

## 2022-06-25 DIAGNOSIS — Z01419 Encounter for gynecological examination (general) (routine) without abnormal findings: Secondary | ICD-10-CM | POA: Diagnosis not present

## 2022-06-26 ENCOUNTER — Other Ambulatory Visit (HOSPITAL_COMMUNITY): Payer: Self-pay

## 2022-06-26 MED ORDER — VENLAFAXINE HCL ER 75 MG PO CP24
75.0000 mg | ORAL_CAPSULE | Freq: Every day | ORAL | 4 refills | Status: DC
  Filled 2022-06-26: qty 90, 90d supply, fill #0
  Filled 2022-09-24: qty 90, 90d supply, fill #1
  Filled 2022-12-21: qty 90, 90d supply, fill #2
  Filled 2023-03-19: qty 90, 90d supply, fill #3

## 2022-06-26 MED ORDER — ESTRADIOL 2 MG PO TABS
2.0000 mg | ORAL_TABLET | Freq: Every day | ORAL | 4 refills | Status: DC
  Filled 2022-06-26: qty 90, 90d supply, fill #0

## 2022-06-26 MED ORDER — ALPRAZOLAM 0.25 MG PO TABS
0.2500 mg | ORAL_TABLET | Freq: Three times a day (TID) | ORAL | 0 refills | Status: DC | PRN
  Filled 2022-06-26: qty 30, 10d supply, fill #0

## 2022-07-03 DIAGNOSIS — Z6832 Body mass index (BMI) 32.0-32.9, adult: Secondary | ICD-10-CM | POA: Diagnosis not present

## 2022-07-03 DIAGNOSIS — I10 Essential (primary) hypertension: Secondary | ICD-10-CM | POA: Diagnosis not present

## 2022-07-03 DIAGNOSIS — L570 Actinic keratosis: Secondary | ICD-10-CM | POA: Diagnosis not present

## 2022-07-03 DIAGNOSIS — E669 Obesity, unspecified: Secondary | ICD-10-CM | POA: Diagnosis not present

## 2022-07-03 DIAGNOSIS — R739 Hyperglycemia, unspecified: Secondary | ICD-10-CM | POA: Diagnosis not present

## 2022-09-01 ENCOUNTER — Other Ambulatory Visit (HOSPITAL_COMMUNITY): Payer: Self-pay

## 2022-12-01 ENCOUNTER — Other Ambulatory Visit (HOSPITAL_COMMUNITY): Payer: Self-pay

## 2022-12-01 MED ORDER — ESTRADIOL 2 MG PO TABS
2.0000 mg | ORAL_TABLET | Freq: Every day | ORAL | 3 refills | Status: DC
  Filled 2022-12-01: qty 90, 90d supply, fill #0
  Filled 2023-03-02: qty 90, 90d supply, fill #1
  Filled 2023-06-01: qty 90, 90d supply, fill #2
  Filled 2023-08-30: qty 90, 90d supply, fill #3

## 2022-12-22 ENCOUNTER — Other Ambulatory Visit (HOSPITAL_COMMUNITY): Payer: Self-pay

## 2023-03-02 ENCOUNTER — Other Ambulatory Visit (HOSPITAL_COMMUNITY): Payer: Self-pay

## 2023-06-14 ENCOUNTER — Encounter (HOSPITAL_BASED_OUTPATIENT_CLINIC_OR_DEPARTMENT_OTHER): Payer: Self-pay | Admitting: Emergency Medicine

## 2023-06-14 ENCOUNTER — Emergency Department (HOSPITAL_BASED_OUTPATIENT_CLINIC_OR_DEPARTMENT_OTHER)
Admission: EM | Admit: 2023-06-14 | Discharge: 2023-06-14 | Disposition: A | Discharge status: Home / Self Care | Payer: No Typology Code available for payment source | Attending: Emergency Medicine | Admitting: Emergency Medicine

## 2023-06-14 ENCOUNTER — Emergency Department (HOSPITAL_BASED_OUTPATIENT_CLINIC_OR_DEPARTMENT_OTHER): Payer: No Typology Code available for payment source

## 2023-06-14 ENCOUNTER — Other Ambulatory Visit: Payer: Self-pay

## 2023-06-14 DIAGNOSIS — Y92000 Kitchen of unspecified non-institutional (private) residence as  the place of occurrence of the external cause: Secondary | ICD-10-CM | POA: Insufficient documentation

## 2023-06-14 DIAGNOSIS — I10 Essential (primary) hypertension: Secondary | ICD-10-CM | POA: Insufficient documentation

## 2023-06-14 DIAGNOSIS — Z79899 Other long term (current) drug therapy: Secondary | ICD-10-CM | POA: Diagnosis not present

## 2023-06-14 DIAGNOSIS — S8391XA Sprain of unspecified site of right knee, initial encounter: Secondary | ICD-10-CM | POA: Diagnosis not present

## 2023-06-14 DIAGNOSIS — S8991XA Unspecified injury of right lower leg, initial encounter: Secondary | ICD-10-CM | POA: Diagnosis present

## 2023-06-14 DIAGNOSIS — W1830XA Fall on same level, unspecified, initial encounter: Secondary | ICD-10-CM | POA: Diagnosis not present

## 2023-06-14 HISTORY — DX: Essential (primary) hypertension: I10

## 2023-06-14 MED ORDER — IBUPROFEN 600 MG PO TABS
600.0000 mg | ORAL_TABLET | Freq: Four times a day (QID) | ORAL | 0 refills | Status: AC | PRN
Start: 2023-06-14 — End: ?

## 2023-06-14 MED ORDER — OXYCODONE HCL 5 MG PO TABS: 5.0000 mg | ORAL_TABLET | ORAL | 0 refills | Status: DC | PRN

## 2023-06-14 MED ORDER — ACETAMINOPHEN 325 MG PO TABS: 650.0000 mg | ORAL_TABLET | Freq: Four times a day (QID) | ORAL | 0 refills | Status: DC | PRN

## 2023-06-14 MED ORDER — CYCLOBENZAPRINE HCL 10 MG PO TABS: 10.0000 mg | ORAL_TABLET | Freq: Two times a day (BID) | ORAL | 0 refills | Status: DC | PRN

## 2023-06-14 NOTE — ED Provider Notes (Signed)
[place] EMERGENCY DEPARTMENT AT [facility] Provider Note  CSN: [id number] Arrival date & time: 06/14/23 7829  Chief Complaint(s) Fall  HPI [name] is a 57 y.o. female with past medical history as below, significant for anxiety, hypertension who presents to the ED with complaint of right knee pain.  Patient ports she fell down last night, apparent mechanical fall.  Pain to right leg from knee to hip.  No head injury, no LOC.  Difficulty ambulating secondary to discomfort.  Patient reports she slipped in the kitchen on dog urine, leg slipped out from underneath her.  She has pain to the posterior aspect of her right knee radiating to her right hip.  Sharp, stabbing at times.  Pain improved with ambulation.  Denies prior knee surgery  Past Medical History Past Medical History:  Diagnosis Date   Anxiety    Hypertension    There are no problems to display for this patient.  Home Medication(s) Prior to Admission medications   Medication Sig Start Date End Date Taking? Authorizing Provider  acetaminophen (TYLENOL) 325 MG tablet Take 2 tablets (650 mg total) by mouth every 6 (six) hours as needed. 06/14/23  Yes Tanda Rockers A, DO  cyclobenzaprine (FLEXERIL) 10 MG tablet Take 1 tablet (10 mg total) by mouth 2 (two) times daily as needed for muscle spasms. 06/14/23  Yes Tanda Rockers A, DO  ibuprofen (ADVIL) 600 MG tablet Take 1 tablet (600 mg total) by mouth every 6 (six) hours as needed. 06/14/23  Yes Tanda Rockers A, DO  oxyCODONE (ROXICODONE) 5 MG immediate release tablet Take 1 tablet (5 mg total) by mouth every 4 (four) hours as needed for severe pain. 06/14/23  Yes Sloan Leiter, DO  ALPRAZolam (XANAX) 0.25 MG tablet TAKE 1 TABLET BY MOUTH EVERY 8 HOURS AS NEEDED 03/26/21     ALPRAZolam (XANAX) 0.25 MG tablet Take 1 tablet (0.25 mg total) by mouth every 8 (eight) hours as needed. 06/25/22     ciprofloxacin (CIPRO) 500 MG tablet Take 1 tablet (500 mg total) by mouth 2 (two)  times daily. 10/30/16   Hedges, Tinnie Gens, PA-C  estradiol (ESTRACE) 2 MG tablet TAKE 1 TABLET BY MOUTH ONCE DAILY 08/29/20 08/29/21  Candice Camp, MD  estradiol (ESTRACE) 2 MG tablet TAKE 1 TABLET BY MOUTH ONCE DAILY 03/26/21     estradiol (ESTRACE) 2 MG tablet Take 1 tablet (2 mg total) by mouth daily. 06/25/22     estradiol (ESTRACE) 2 MG tablet Take 1 tablet (2 mg total) by mouth daily. 12/01/22     ondansetron (ZOFRAN) 4 MG tablet Take 1 tablet (4 mg total) by mouth every 6 (six) hours. 10/30/16   Hedges, Tinnie Gens, PA-C  predniSONE (DELTASONE) 20 MG tablet 3 Tabs PO Days 1-3, then 2 tabs PO Days 4-6, then 1 tab PO Day 7-9, then Half Tab PO Day 10-12 01/31/18   Renne Crigler, PA-C  venlafaxine (EFFEXOR) 75 MG tablet Take 75 mg by mouth 2 (two) times daily.    [provider]  venlafaxine XR (EFFEXOR-XR) 150 MG 24 hr capsule TAKE 1 CAPSULE BY MOUTH EVERY MORNING 12/31/20 12/31/21  Candice Camp, MD  venlafaxine XR (EFFEXOR-XR) 150 MG 24 hr capsule TAKE 1 CAPSULE BY MOUTH EVERY MORNING 09/28/20 09/28/21  Candice Camp, MD  venlafaxine XR (EFFEXOR-XR) 150 MG 24 hr capsule Take 1 capsule (150 mg total) by mouth every morning. 03/28/21     venlafaxine XR (EFFEXOR-XR) 150 MG 24 hr capsule Take 1 capsule (150  mg total) by mouth every morning. 04/28/22     venlafaxine XR (EFFEXOR-XR) 75 MG 24 hr capsule Take 1 capsule (75 mg total) by mouth daily. 06/25/22                                                                                                                                       Past Surgical History Past Surgical History:  Procedure Laterality Date   ABDOMINAL HYSTERECTOMY     Family History History reviewed. No pertinent family history.  Social History Social History   Tobacco Use   Smoking status: Never   Smokeless tobacco: Never  Vaping Use   Vaping status: Never Used  Substance Use Topics   Alcohol use: Yes    Comment: occ   Drug use: No   Allergies Ciprofloxacin, Macrobid  [nitrofurantoin macrocrystal], and Amoxicillin  Review of Systems Review of Systems  Constitutional:  Negative for chills and fever.  Respiratory:  Negative for chest tightness and shortness of breath.   Gastrointestinal:  Negative for abdominal pain and vomiting.  Musculoskeletal:  Positive for arthralgias. Negative for back pain.  Neurological:  Negative for syncope and headaches.  All other systems reviewed and are negative.   Physical Exam Vital Signs  I have reviewed the triage vital signs BP (!) 141/65   Pulse 83   Temp 98.7 F (37.1 C) (Oral)   Resp 18   Ht 4\' 10"  (1.473 m)   Wt 70.3 kg   SpO2 95%   BMI 32.40 kg/m  Physical Exam Vitals and nursing note reviewed.  Constitutional:      General: She is not in acute distress.    Appearance: Normal appearance. She is well-developed. She is not ill-appearing.  HENT:     Head: Normocephalic and atraumatic.     Right Ear: External ear normal.     Left Ear: External ear normal.     Nose: Nose normal.     Mouth/Throat:     Mouth: Mucous membranes are moist.  Eyes:     General: No scleral icterus.       Right eye: No discharge.        Left eye: No discharge.  Cardiovascular:     Rate and Rhythm: Normal rate.  Pulmonary:     Effort: Pulmonary effort is normal. No respiratory distress.     Breath sounds: No stridor.  Abdominal:     General: Abdomen is flat. There is no distension.     Tenderness: There is no guarding.  Musculoskeletal:        General: No deformity.     Cervical back: No rigidity.       Legs:     Comments: LE NVI bilateral. No significant pain with varus or valgus testing to the right knee.  Negative anterior/posterior drawer.  Achilles tendon intact.  Quad tendon intact.  No external signs of injury the knee.  No pain on palpation to the ankle or hip ipsilateral  Skin:    General: Skin is warm and dry.     Coloration: Skin is not cyanotic, jaundiced or pale.  Neurological:     Mental Status: She  is alert and oriented to person, place, and time.     GCS: GCS eye subscore is 4. GCS verbal subscore is 5. GCS motor subscore is 6.  Psychiatric:        Speech: Speech normal.        Behavior: Behavior normal. Behavior is cooperative.     ED Results and Treatments Labs (all labs ordered are listed, but only abnormal results are displayed) Labs Reviewed - No data to display                                                                                                                        Radiology DG Knee Complete 4 Views Right  Result Date: 06/14/2023 CLINICAL DATA:  Fall, posterior knee pain EXAM: RIGHT KNEE - COMPLETE 4+ VIEW COMPARISON:  None Available. FINDINGS: No evidence of fracture, dislocation, or joint effusion. No evidence of arthropathy or other focal bone abnormality. Soft tissues are unremarkable. IMPRESSION: Negative. Electronically Signed   By: Malachy Moan M.D.   On: 06/14/2023 08:52    Pertinent labs & imaging results that were available during my care of the patient were reviewed by me and considered in my medical decision making (see MDM for details).  Medications Ordered in ED Medications - No data to display                                                                                                                                   Procedures Procedures  (including critical care time)  Medical Decision Making / ED Course    Medical Decision Making:    AMEKA LINFORD is a 57 y.o. female  with past medical history as below, significant for anxiety, hypertension who presents to the ED with complaint of right knee pain.. The complaint involves an extensive differential diagnosis and also carries with it a high risk of complications and morbidity.  Serious etiology was considered. Ddx includes but is not limited to: Sprain, strain, soft tissue injury, dislocation, fracture, etc.  Complete initial physical exam performed, notably the patient  was  NAD, HDS.    Reviewed and confirmed nursing documentation for past medical  history, family history, social history.  Vital signs reviewed.        Will get x-ray, she does not want analgesics at this time.  X-ray reviewed, imaging stable, no fracture or dislocation.  Repeat exam is reassuring.  Favor knee sprain  Knee sleeve/ crutches/ limited WB/ analgesics for home/ f/u with ortho if symptoms persist  The patient improved significantly and was discharged in stable condition. Detailed discussions were had with the patient regarding current findings, and need for close f/u with PCP or on call doctor. The patient has been instructed to return immediately if the symptoms worsen in any way for re-evaluation. Patient verbalized understanding and is in agreement with current care plan. All questions answered prior to discharge.                 Additional history obtained: -Additional history obtained from spouse -External records from outside source obtained and reviewed including: Chart review including previous notes, labs, imaging, consultation notes including  Prior ED visits, prior labs and imaging, home medications, PDMP   Lab Tests: na  EKG   EKG Interpretation Date/Time:    Ventricular Rate:    PR Interval:    QRS Duration:    QT Interval:    QTC Calculation:   R Axis:      Text Interpretation:           Imaging Studies ordered: I ordered imaging studies including knee xr I independently visualized the following imaging with scope of interpretation limited to determining acute life threatening conditions related to emergency care; findings noted above, significant for no fx  I independently visualized and interpreted imaging. I agree with the radiologist interpretation   Medicines ordered and prescription drug management: Meds ordered this encounter  Medications   oxyCODONE (ROXICODONE) 5 MG immediate release tablet    Sig: Take 1 tablet (5 mg  total) by mouth every 4 (four) hours as needed for severe pain.    Dispense:  5 tablet    Refill:  0   ibuprofen (ADVIL) 600 MG tablet    Sig: Take 1 tablet (600 mg total) by mouth every 6 (six) hours as needed.    Dispense:  30 tablet    Refill:  0   acetaminophen (TYLENOL) 325 MG tablet    Sig: Take 2 tablets (650 mg total) by mouth every 6 (six) hours as needed.    Dispense:  36 tablet    Refill:  0   cyclobenzaprine (FLEXERIL) 10 MG tablet    Sig: Take 1 tablet (10 mg total) by mouth 2 (two) times daily as needed for muscle spasms.    Dispense:  14 tablet    Refill:  0    -I have reviewed the patients home medicines and have made adjustments as needed   Consultations Obtained: na   Cardiac Monitoring: Continuous pulse oximetry interpreted by myself, 99% on RA.    Social Determinants of Health:  Diagnosis or treatment significantly limited by social determinants of health: non smoker   Reevaluation: After the interventions noted above, I reevaluated the patient and found that they have stayed the same  Co morbidities that complicate the patient evaluation  Past Medical History:  Diagnosis Date   Anxiety    Hypertension       Dispostion: Disposition decision including need for hospitalization was considered, and patient discharged from emergency department.    Final Clinical Impression(s) / ED Diagnoses Final diagnoses:  Sprain of right knee, unspecified ligament, initial  encounter        Sloan Leiter, DO 06/14/23 [phone]

## 2023-06-14 NOTE — ED Triage Notes (Signed)
Pt arrives pov, bib wheelchair with c/o posterior, proximal RLE pain after mechanical fall last night. Pt reports posterior pain from RT hip to knee

## 2023-06-14 NOTE — Discharge Instructions (Signed)
Please call Dr Shon Baton office on Tuesday to arrange follow up  It was a pleasure caring for you today in the emergency department.  Please return to the emergency department for any worsening or worrisome symptoms.

## 2023-06-16 ENCOUNTER — Ambulatory Visit (HOSPITAL_BASED_OUTPATIENT_CLINIC_OR_DEPARTMENT_OTHER): Payer: No Typology Code available for payment source | Admitting: Student

## 2023-06-16 ENCOUNTER — Ambulatory Visit (INDEPENDENT_AMBULATORY_CARE_PROVIDER_SITE_OTHER): Payer: No Typology Code available for payment source | Admitting: Student

## 2023-06-16 ENCOUNTER — Other Ambulatory Visit (HOSPITAL_BASED_OUTPATIENT_CLINIC_OR_DEPARTMENT_OTHER): Payer: Self-pay

## 2023-06-16 ENCOUNTER — Encounter (HOSPITAL_BASED_OUTPATIENT_CLINIC_OR_DEPARTMENT_OTHER): Payer: Self-pay | Admitting: Student

## 2023-06-16 DIAGNOSIS — S76311A Strain of muscle, fascia and tendon of the posterior muscle group at thigh level, right thigh, initial encounter: Secondary | ICD-10-CM

## 2023-06-16 MED ORDER — TRAMADOL HCL 50 MG PO TABS
50.0000 mg | ORAL_TABLET | Freq: Four times a day (QID) | ORAL | 0 refills | Status: DC | PRN
  Filled 2023-06-16: qty 12, 3d supply, fill #0

## 2023-06-16 MED ORDER — METHOCARBAMOL 500 MG PO TABS
500.0000 mg | ORAL_TABLET | Freq: Four times a day (QID) | ORAL | 0 refills | Status: AC | PRN
  Filled 2023-06-16: qty 40, 10d supply, fill #0

## 2023-06-16 NOTE — Progress Notes (Signed)
Chief Complaint: Right leg pain     History of Present Illness:    [name] is a 57 y.o. female presenting today for evaluation of right leg pain.  She had a fall 3 nights ago after she slipped on dog urine that was on the floor.  Her right leg slid out from under her, and she almost went into a full split.  She was seen in the emergency department the next day and was given Flexeril 10 mg, ibuprofen 600 mg, and oxycodone 5 mg.  X-rays appeared negative and she was also given a knee immobilizer.  The pain is located in the back of the right thigh, from the middle up toward the buttock.  After the injury, she had an extremely hard time weightbearing however this has been slowly improving.  Pain remains at least moderate in intensity depending on activity.  She generally does have a hard time getting comfortable due to the pain.  She works at a [employer] from home.    Surgical History:   None  PMH/PSH/Family History/Social History/Meds/Allergies:    Past Medical History:  Diagnosis Date   Anxiety    Hypertension    Past Surgical History:  Procedure Laterality Date   ABDOMINAL HYSTERECTOMY     Social History   Socioeconomic History   Marital status: Married    Spouse name: Not on file   Number of children: Not on file   Years of education: Not on file   Highest education level: Not on file  Occupational History   Not on file  Tobacco Use   Smoking status: Never   Smokeless tobacco: Never  Vaping Use   Vaping status: Never Used  Substance and Sexual Activity   Alcohol use: Yes    Comment: occ   Drug use: No   Sexual activity: Not on file  Other Topics Concern   Not on file  Social History Narrative   Not on file   Social Determinants of Health   Financial Resource Strain: Not on file  Food Insecurity: Not on file  Transportation Needs: Not on file  Physical Activity: Not on file  Stress: Not on file  Social Connections:  Not on file   History reviewed. No pertinent family history. Allergies  Allergen Reactions   Ciprofloxacin    Macrobid [Nitrofurantoin Macrocrystal] Other (See Comments)    Drug induced fever    Amoxicillin Rash   Current Outpatient Medications  Medication Sig Dispense Refill   acetaminophen (TYLENOL) 325 MG tablet Take 2 tablets (650 mg total) by mouth every 6 (six) hours as needed. 36 tablet 0   ALPRAZolam (XANAX) 0.25 MG tablet TAKE 1 TABLET BY MOUTH EVERY 8 HOURS AS NEEDED 30 tablet 0   ALPRAZolam (XANAX) 0.25 MG tablet Take 1 tablet (0.25 mg total) by mouth every 8 (eight) hours as needed. 30 tablet 0   ciprofloxacin (CIPRO) 500 MG tablet Take 1 tablet (500 mg total) by mouth 2 (two) times daily. 14 tablet 0   estradiol (ESTRACE) 2 MG tablet TAKE 1 TABLET BY MOUTH ONCE DAILY 30 tablet 12   estradiol (ESTRACE) 2 MG tablet Take 1 tablet (2 mg total) by mouth daily. 90 tablet 4   estradiol (ESTRACE) 2 MG tablet Take 1 tablet (2 mg total) by mouth daily. 90  tablet 3   ibuprofen (ADVIL) 600 MG tablet Take 1 tablet (600 mg total) by mouth every 6 (six) hours as needed. 30 tablet 0   methocarbamol (ROBAXIN) 500 MG tablet Take 1 tablet (500 mg total) by mouth every 6 (six) hours as needed for up to 10 days for muscle spasms. 40 tablet 0   ondansetron (ZOFRAN) 4 MG tablet Take 1 tablet (4 mg total) by mouth every 6 (six) hours. 12 tablet 0   predniSONE (DELTASONE) 20 MG tablet 3 Tabs PO Days 1-3, then 2 tabs PO Days 4-6, then 1 tab PO Day 7-9, then Half Tab PO Day 10-12 20 tablet 0   traMADol (ULTRAM) 50 MG tablet Take 1 tablet (50 mg total) by mouth every 6 (six) hours as needed for up to 3 days. 12 tablet 0   venlafaxine (EFFEXOR) 75 MG tablet Take 75 mg by mouth 2 (two) times daily.     venlafaxine XR (EFFEXOR-XR) 150 MG 24 hr capsule Take 1 capsule (150 mg total) by mouth every morning. 90 capsule 4   venlafaxine XR (EFFEXOR-XR) 150 MG 24 hr capsule Take 1 capsule (150 mg total) by mouth  every morning. 30 capsule 12   venlafaxine XR (EFFEXOR-XR) 75 MG 24 hr capsule Take 1 capsule (75 mg total) by mouth daily. 90 capsule 4   estradiol (ESTRACE) 2 MG tablet TAKE 1 TABLET BY MOUTH ONCE DAILY 90 tablet 4   venlafaxine XR (EFFEXOR-XR) 150 MG 24 hr capsule TAKE 1 CAPSULE BY MOUTH EVERY MORNING 90 capsule 0   venlafaxine XR (EFFEXOR-XR) 150 MG 24 hr capsule TAKE 1 CAPSULE BY MOUTH EVERY MORNING 90 capsule 0   No current facility-administered medications for this visit.   No results found.  Review of Systems:   A ROS was performed including pertinent positives and negatives as documented in the HPI.  Physical Exam :   Constitutional: NAD and appears stated age Neurological: Alert and oriented Psych: Appropriate affect and cooperative There were no vitals taken for this visit.   Comprehensive Musculoskeletal Exam:    Tenderness palpation over the muscle belly of the right hamstrings extending proximally to the ischial tuberosity.  No tenderness over the distal hamstring insertions or throughout the right knee.  Active knee range of motion from 0 degrees extension which does cause muscle spasms in the hamstrings to 120 degrees flexion.  Knee flexion strength is 4/5 compared to 5/5 with knee extension.  Imaging:   Xray review from emergency department on 06/14/2023 (right knee 4 views): Negative    I personally reviewed and interpreted the radiographs.   Assessment:   57 y.o. female with posterior right thigh pain consistent with a hamstring strain.  Low suspicion at this time for a significant tear.  Discussed that these injuries can take many weeks to fully recover.  Recommend continuing with conservative therapies including rest and ice, as well as refraining from any high impact or high velocity activities.  I will send her in some Robaxin as replacement for the Flexeril as this has made her extremely drowsy.  Will also provide a few days worth of tramadol as she has been  needing extra pain relief at night in order to go to sleep.  Will plan to see her back in 4 weeks for reassessment, and at that time can consider possible physical therapy or shock therapy depending on progress.  Plan :    -Return to clinic in 4 weeks for reassessment  I personally saw and evaluated the patient, and participated in the management and treatment plan.  Hazle Nordmann, PA-C Orthopedics

## 2023-06-17 ENCOUNTER — Encounter (HOSPITAL_BASED_OUTPATIENT_CLINIC_OR_DEPARTMENT_OTHER): Payer: Self-pay

## 2023-06-24 ENCOUNTER — Other Ambulatory Visit (HOSPITAL_COMMUNITY): Payer: Self-pay

## 2023-06-24 MED ORDER — VENLAFAXINE HCL ER 150 MG PO CP24
150.0000 mg | ORAL_CAPSULE | Freq: Every morning | ORAL | 1 refills | Status: DC
  Filled 2023-06-24: qty 30, 30d supply, fill #0
  Filled 2023-07-20: qty 30, 30d supply, fill #1

## 2023-07-02 ENCOUNTER — Other Ambulatory Visit (HOSPITAL_BASED_OUTPATIENT_CLINIC_OR_DEPARTMENT_OTHER): Payer: Self-pay | Admitting: Student

## 2023-07-07 ENCOUNTER — Other Ambulatory Visit (HOSPITAL_BASED_OUTPATIENT_CLINIC_OR_DEPARTMENT_OTHER): Payer: Self-pay

## 2023-07-07 ENCOUNTER — Encounter (HOSPITAL_BASED_OUTPATIENT_CLINIC_OR_DEPARTMENT_OTHER): Payer: Self-pay

## 2023-07-07 ENCOUNTER — Other Ambulatory Visit (HOSPITAL_BASED_OUTPATIENT_CLINIC_OR_DEPARTMENT_OTHER): Payer: Self-pay | Admitting: Student

## 2023-07-07 MED ORDER — TRAMADOL HCL 50 MG PO TABS
50.0000 mg | ORAL_TABLET | Freq: Four times a day (QID) | ORAL | 0 refills | Status: AC | PRN
  Filled 2023-07-07: qty 12, 3d supply, fill #0

## 2023-07-13 ENCOUNTER — Ambulatory Visit (HOSPITAL_BASED_OUTPATIENT_CLINIC_OR_DEPARTMENT_OTHER): Payer: No Typology Code available for payment source | Admitting: Student

## 2023-07-13 ENCOUNTER — Encounter (HOSPITAL_BASED_OUTPATIENT_CLINIC_OR_DEPARTMENT_OTHER): Payer: Self-pay | Admitting: Student

## 2023-07-13 DIAGNOSIS — S76311A Strain of muscle, fascia and tendon of the posterior muscle group at thigh level, right thigh, initial encounter: Secondary | ICD-10-CM

## 2023-07-13 NOTE — Progress Notes (Signed)
Chief Complaint: Right leg pain     History of Present Illness:    07/13/23: [name] is here today for follow-up evaluation of a right hamstring strain.  Overall she reports doing much better today.  Her pain levels have improved and she rates it at a 5/10 at the worst.  She does continue to have some discomfort while sitting, which she has to do all day for work.  Has been taking ibuprofen, Robaxin, tramadol before bedtime, and Voltaren.   06/16/23: [name] is a 57 y.o. female presenting today for evaluation of right leg pain.  She had a fall 3 nights ago after she slipped on dog urine that was on the floor.  Her right leg slid out from under her, and she almost went into a full split.  She was seen in the emergency department the next day and was given Flexeril 10 mg, ibuprofen 600 mg, and oxycodone 5 mg.  X-rays appeared negative and she was also given a knee immobilizer.  The pain is located in the back of the right thigh, from the middle up toward the buttock.  After the injury, she had an extremely hard time weightbearing however this has been slowly improving.  Pain remains at least moderate in intensity depending on activity.  She generally does have a hard time getting comfortable due to the pain.  She works at a [employer] from home.    Surgical History:   None  PMH/PSH/Family History/Social History/Meds/Allergies:    Past Medical History:  Diagnosis Date   Anxiety    Hypertension    Past Surgical History:  Procedure Laterality Date   ABDOMINAL HYSTERECTOMY     Social History   Socioeconomic History   Marital status: Married    Spouse name: Not on file   Number of children: Not on file   Years of education: Not on file   Highest education level: Not on file  Occupational History   Not on file  Tobacco Use   Smoking status: Never   Smokeless tobacco: Never  Vaping Use   Vaping status: Never Used  Substance and Sexual  Activity   Alcohol use: Yes    Comment: occ   Drug use: No   Sexual activity: Not on file  Other Topics Concern   Not on file  Social History Narrative   Not on file   Social Determinants of Health   Financial Resource Strain: Not on file  Food Insecurity: Not on file  Transportation Needs: Not on file  Physical Activity: Not on file  Stress: Not on file  Social Connections: Not on file   History reviewed. No pertinent family history. Allergies  Allergen Reactions   Ciprofloxacin    Macrobid [Nitrofurantoin Macrocrystal] Other (See Comments)    Drug induced fever    Amoxicillin Rash   Current Outpatient Medications  Medication Sig Dispense Refill   acetaminophen (TYLENOL) 325 MG tablet Take 2 tablets (650 mg total) by mouth every 6 (six) hours as needed. 36 tablet 0   ALPRAZolam (XANAX) 0.25 MG tablet TAKE 1 TABLET BY MOUTH EVERY 8 HOURS AS NEEDED 30 tablet 0   ALPRAZolam (XANAX) 0.25 MG tablet Take 1 tablet (0.25 mg total) by mouth every 8 (eight) hours as needed. 30 tablet 0   ciprofloxacin (CIPRO) 500  MG tablet Take 1 tablet (500 mg total) by mouth 2 (two) times daily. 14 tablet 0   estradiol (ESTRACE) 2 MG tablet TAKE 1 TABLET BY MOUTH ONCE DAILY 90 tablet 4   estradiol (ESTRACE) 2 MG tablet TAKE 1 TABLET BY MOUTH ONCE DAILY 30 tablet 12   estradiol (ESTRACE) 2 MG tablet Take 1 tablet (2 mg total) by mouth daily. 90 tablet 4   estradiol (ESTRACE) 2 MG tablet Take 1 tablet (2 mg total) by mouth daily. 90 tablet 3   ibuprofen (ADVIL) 600 MG tablet Take 1 tablet (600 mg total) by mouth every 6 (six) hours as needed. 30 tablet 0   ondansetron (ZOFRAN) 4 MG tablet Take 1 tablet (4 mg total) by mouth every 6 (six) hours. 12 tablet 0   predniSONE (DELTASONE) 20 MG tablet 3 Tabs PO Days 1-3, then 2 tabs PO Days 4-6, then 1 tab PO Day 7-9, then Half Tab PO Day 10-12 20 tablet 0   venlafaxine (EFFEXOR) 75 MG tablet Take 75 mg by mouth 2 (two) times daily.     venlafaxine XR  (EFFEXOR-XR) 150 MG 24 hr capsule TAKE 1 CAPSULE BY MOUTH EVERY MORNING 90 capsule 0   venlafaxine XR (EFFEXOR-XR) 150 MG 24 hr capsule TAKE 1 CAPSULE BY MOUTH EVERY MORNING 90 capsule 0   venlafaxine XR (EFFEXOR-XR) 150 MG 24 hr capsule Take 1 capsule (150 mg total) by mouth every morning. 90 capsule 4   venlafaxine XR (EFFEXOR-XR) 150 MG 24 hr capsule Take 1 capsule (150 mg total) by mouth in the morning. 30 capsule 1   venlafaxine XR (EFFEXOR-XR) 75 MG 24 hr capsule Take 1 capsule (75 mg total) by mouth daily. 90 capsule 4   No current facility-administered medications for this visit.   No results found.  Review of Systems:   A ROS was performed including pertinent positives and negatives as documented in the HPI.  Physical Exam :   Constitutional: NAD and appears stated age Neurological: Alert and oriented Psych: Appropriate affect and cooperative There were no vitals taken for this visit.   Comprehensive Musculoskeletal Exam:    Tenderness to palpation over the mid hamstring over the posterior right thigh.  Active range of motion from 0 to 120 degrees without difficulty.  Resisted knee flexion strength is still somewhat decreased compared to contralateral side.   Imaging:    Assessment:   57 y.o. female 4 weeks status post right hamstring strain.  She is improving symptomatically but her biggest complaint today is discomfort while in a seated position.  There is still a strength deficit as well with knee flexion.  We have discussed multiple treatment options including continuing with conservative therapy and rest, adding on physical therapy, and a referral to Dr. Shon Baton for shock therapy.  She would like to monitor her symptoms over the next 2 to 3 weeks and will consider pursuing 1 of these referrals should she feel that this is necessary.  Will plan to see her back in clinic as needed.  Plan :    -Return to clinic as needed -Consider referrals to physical therapy or Dr. Madelyn Brunner for shock therapy     I personally saw and evaluated the patient, and participated in the management and treatment plan.  Hazle Nordmann, PA-C Orthopedics

## 2023-08-05 ENCOUNTER — Other Ambulatory Visit: Payer: Self-pay

## 2023-08-05 ENCOUNTER — Other Ambulatory Visit (HOSPITAL_COMMUNITY): Payer: Self-pay

## 2023-08-05 MED ORDER — ALPRAZOLAM 0.25 MG PO TABS
0.2500 mg | ORAL_TABLET | Freq: Three times a day (TID) | ORAL | 0 refills | Status: AC | PRN
Start: 2023-08-04 — End: ?
  Filled 2023-08-05 – 2023-11-30 (×2): qty 30, 10d supply, fill #0

## 2023-08-05 MED ORDER — ESTRADIOL 2 MG PO TABS
2.0000 mg | ORAL_TABLET | Freq: Every day | ORAL | 4 refills | Status: DC
  Filled 2023-11-30: qty 90, 90d supply, fill #0
  Filled 2024-03-03: qty 90, 90d supply, fill #1
  Filled 2024-06-01: qty 90, 90d supply, fill #2

## 2023-08-05 MED ORDER — VENLAFAXINE HCL ER 150 MG PO CP24
150.0000 mg | ORAL_CAPSULE | Freq: Every morning | ORAL | 4 refills | Status: DC
  Filled 2023-08-20: qty 90, 90d supply, fill #0
  Filled 2023-11-15: qty 90, 90d supply, fill #1

## 2023-08-10 ENCOUNTER — Encounter (HOSPITAL_BASED_OUTPATIENT_CLINIC_OR_DEPARTMENT_OTHER): Payer: Self-pay

## 2023-08-10 DIAGNOSIS — S76311A Strain of muscle, fascia and tendon of the posterior muscle group at thigh level, right thigh, initial encounter: Secondary | ICD-10-CM

## 2023-08-11 NOTE — Telephone Encounter (Signed)
Referral was placed in the chart  

## 2023-08-18 ENCOUNTER — Other Ambulatory Visit (HOSPITAL_COMMUNITY): Payer: Self-pay

## 2023-08-20 ENCOUNTER — Other Ambulatory Visit (HOSPITAL_COMMUNITY): Payer: Self-pay

## 2023-08-21 ENCOUNTER — Other Ambulatory Visit (HOSPITAL_COMMUNITY): Payer: Self-pay

## 2023-08-24 ENCOUNTER — Encounter: Payer: Self-pay | Admitting: Sports Medicine

## 2023-08-24 ENCOUNTER — Ambulatory Visit (INDEPENDENT_AMBULATORY_CARE_PROVIDER_SITE_OTHER): Payer: No Typology Code available for payment source | Admitting: Sports Medicine

## 2023-08-24 DIAGNOSIS — S76311D Strain of muscle, fascia and tendon of the posterior muscle group at thigh level, right thigh, subsequent encounter: Secondary | ICD-10-CM | POA: Diagnosis not present

## 2023-08-24 DIAGNOSIS — M7918 Myalgia, other site: Secondary | ICD-10-CM

## 2023-08-24 NOTE — Progress Notes (Signed)
[name] - 57 y.o. female MRN [id number]  Date of birth: 07-18-1966  Office Visit Note: Visit Date: 08/24/2023 PCP: Patient, No Pcp Per Referred by: Amador Cunas, PA*  Subjective: Chief Complaint  Patient presents with   Right Leg - Pain   HPI: [name] is a pleasant 57 y.o. female who presents today for right hamstring pain.  She had a fall on 06/14/23 when she slipped on dog urine that was on the floor. Her right leg slid out from under her, and she almost went into a full split. She was seen in the emergency department the next day and was given Flexeril 10 mg, ibuprofen 600 mg, and oxycodone 5 mg. X-ray was negative for bony fracture or abnormality.  Her pain currently localizes more to the ischial tuberosity near the proximal hamstring.  She is not doing any stretching, rehab or PT.  She is off all of her scheduled medications, only taking ibuprofen and muscle relaxer as needed.  Pertinent ROS were reviewed with the patient and found to be negative unless otherwise specified above in HPI.   Assessment & Plan: Visit Diagnoses:  1. Hamstring strain, right, subsequent encounter   2. Right buttock pain    Plan: Discussed with [name] the nature of her chronic right hamstring pain which bothers her more so over the proximal insertion of the ischial tuberosity.  We did proceed with a trial of extracorporeal shockwave therapy, she tolerated well.  We will see what sort of relief she gets from the first 1-2 treatments and then decide on additional treatments if needed and finding beneficial.  She may continue ibuprofen and her muscle relaxer only as needed.  I would like her to get started in some rehab and activity for the hamstring, she is not interested in formalized physical therapy at this time.  We did provide her a customized handout for the hamstring, my athletic trainer Isabelle Course did review these with her in the room today.  She is to perform these once daily.  She  will follow-up in the next 1-2 weeks for repeat treatment and evaluation.  Follow-up: Return for f/u in 1-2 weeks for R-hamstring .   Meds & Orders: No orders of the defined types were placed in this encounter.  No orders of the defined types were placed in this encounter.    Procedures:       Clinical History: No specialty comments available.  She reports that she has never smoked. She has never used smokeless tobacco. No results for input(s): "HGBA1C", "LABURIC" in the last 8760 hours.  Objective:    Physical Exam  Gen: Well-appearing, in no acute distress; non-toxic WU:[id number]. Warm.  Resp: Breathing unlabored on room air; no wheezing. Psych: Fluid speech in conversation; appropriate affect; normal thought process Neuro: Sensation intact throughout. No gross coordination deficits.   Ortho Exam - Right leg: Positive TTP over the right ischial tuberosity and palpating down the proximal hamstring origin.  There is fluid range of motion about the hip.  There is no swelling or ecchymosis noted.  Imaging:  DG Knee Complete 4 Views Right CLINICAL DATA:  Fall, posterior knee pain  EXAM: RIGHT KNEE - COMPLETE 4+ VIEW  COMPARISON:  None Available.  FINDINGS: No evidence of fracture, dislocation, or joint effusion. No evidence of arthropathy or other focal bone abnormality. Soft tissues are unremarkable.  IMPRESSION: Negative.  Electronically Signed   By: Malachy Moan M.D.   On: 06/14/2023 08:52  Past  Medical/Family/Surgical/Social History: Medications & Allergies reviewed per EMR, new medications updated. There are no problems to display for this patient.  Past Medical History:  Diagnosis Date   Anxiety    Hypertension    No family history on file. Past Surgical History:  Procedure Laterality Date   ABDOMINAL HYSTERECTOMY     Social History   Occupational History   Not on file  Tobacco Use   Smoking status: Never   Smokeless tobacco: Never   Vaping Use   Vaping status: Never Used  Substance and Sexual Activity   Alcohol use: Yes    Comment: occ   Drug use: No   Sexual activity: Not on file

## 2023-08-24 NOTE — Progress Notes (Signed)
Patient says that she has had pain in her right hamstring since slipping on Labor Day. She says that it is mostly okay, but she sits for 8+ hours a day at work and sitting is most bothersome. She says that she takes a muscle relaxer as needed, as well as Ibuprofen as needed. Patient points towards hamstring origin with describing her pain.  Patient was instructed in 10 minutes of therapeutic exercises for right hamstring to improve strength, ROM and function according to my instructions and plan of care by a Certified Athletic Trainer during the office visit. A customized handout was provided and demonstration of proper technique shown and discussed. Patient did perform exercises and demonstrate understanding through teachback.  All questions discussed and answered.

## 2023-09-07 ENCOUNTER — Ambulatory Visit: Payer: No Typology Code available for payment source | Admitting: Sports Medicine

## 2023-09-21 ENCOUNTER — Ambulatory Visit: Payer: BC Managed Care – PPO | Admitting: Sports Medicine

## 2023-09-21 ENCOUNTER — Encounter: Payer: Self-pay | Admitting: Sports Medicine

## 2023-09-21 DIAGNOSIS — S76311D Strain of muscle, fascia and tendon of the posterior muscle group at thigh level, right thigh, subsequent encounter: Secondary | ICD-10-CM

## 2023-09-21 NOTE — Progress Notes (Signed)
[name] - 57 y.o. female MRN [id number]  Date of birth: 1966/01/24  Office Visit Note: Visit Date: 09/21/2023 PCP: Patient, No Pcp Per Referred by: No ref. provider found  Subjective: Chief Complaint  Patient presents with   Right Leg - Follow-up   HPI: [name] is a pleasant 57 y.o. female who presents today for follow-up of right hamstring pain/injury.  She had a fall on 06/14/23 when she slipped on dog urine that was on the floor. Her right leg slid out from under her, and she almost went into a full split.  Last visit on 08/24/2023 we did proceed with extracorporeal shockwave therapy, she did find this to be very beneficial -maybe 75% improved. In general, she is not having much pain and when she sits for longer periods of time.  She does think shockwave was helpful and is interested in a repeat treatment today.  She does admit, she has not been as adherent to the home exercises but still has these and the handout.  Pertinent ROS were reviewed with the patient and found to be negative unless otherwise specified above in HPI.   Assessment & Plan: Visit Diagnoses:  1. Hamstring strain, right, subsequent encounter    Plan: [name] had a very good response to our first extracorporeal shockwave therapy, with this as well as time and doing some HEP she has found good improvement.  Through shared decision-making, we did repeat an additional ESWT today which she tolerated well.  I would like her to do her home therapy 2-3 times weekly to help augment her healing.  She may use ibuprofen as needed.  Did discuss the role for a few additional shockwave treatments if she is not largely improved, she will see how she does over the next week and notify me of progress/desire for repeat treatments.  Follow-up: Return for f/u in 1-2 weeks if needed for hamstring treatment/ESWT.   Meds & Orders: No orders of the defined types were placed in this encounter.  No orders of the defined  types were placed in this encounter.    Procedures: Procedure: ECSWT Indications:  Hamstring strain/injury    Procedure Details Consent: Risks of procedure as well as the alternatives and risks of each were explained to the patient.  Verbal consent for procedure obtained. Time Out: Verified patient identification, verified procedure, site was marked, verified correct patient position. The area was cleaned with alcohol swab.     The Right hamstring (mid-belly) was targeted for Extracorporeal shockwave therapy.    Preset: Status post muscular injury Power Level: 120 mJ Frequency: 14 Hz Impulse/cycles: 3600 Head size: Regular   Patient tolerated procedure well without immediate complications.       Clinical History: No specialty comments available.  She reports that she has never smoked. She has never used smokeless tobacco. No results for input(s): "HGBA1C", "LABURIC" in the last 8760 hours.  Objective:    Physical Exam  Gen: Well-appearing, in no acute distress; non-toxic CV: Well-perfused. Warm.  Resp: Breathing unlabored on room air; no wheezing. Psych: Fluid speech in conversation; appropriate affect; normal thought process Neuro: Sensation intact throughout. No gross coordination deficits.   Ortho Exam - RLE: Mild TTP with deep palpation of the mid belly of the hamstring, most notably over the biceps femoris region.  There is full range of motion about the knee.  No redness swelling or ecchymosis.  Imaging: No results found.  Past Medical/Family/Surgical/Social History: Medications & Allergies reviewed per EMR,  new medications updated. There are no problems to display for this patient.  Past Medical History:  Diagnosis Date   Anxiety    Hypertension    No family history on file. Past Surgical History:  Procedure Laterality Date   ABDOMINAL HYSTERECTOMY     Social History   Occupational History   Not on file  Tobacco Use   Smoking status: Never    Smokeless tobacco: Never  Vaping Use   Vaping status: Never Used  Substance and Sexual Activity   Alcohol use: Yes    Comment: occ   Drug use: No   Sexual activity: Not on file

## 2023-09-21 NOTE — Progress Notes (Signed)
Patient says that she is feeling better. She says that she has not been doing the home exercises but she does think that the treatment has helped.

## 2023-11-16 ENCOUNTER — Other Ambulatory Visit (HOSPITAL_COMMUNITY): Payer: Self-pay

## 2023-11-30 ENCOUNTER — Other Ambulatory Visit (HOSPITAL_COMMUNITY): Payer: Self-pay

## 2024-01-20 DIAGNOSIS — R12 Heartburn: Secondary | ICD-10-CM | POA: Diagnosis not present

## 2024-01-20 DIAGNOSIS — I10 Essential (primary) hypertension: Secondary | ICD-10-CM | POA: Diagnosis not present

## 2024-01-20 DIAGNOSIS — Z0000 Encounter for general adult medical examination without abnormal findings: Secondary | ICD-10-CM | POA: Diagnosis not present

## 2024-01-20 DIAGNOSIS — R739 Hyperglycemia, unspecified: Secondary | ICD-10-CM | POA: Diagnosis not present

## 2024-02-16 ENCOUNTER — Other Ambulatory Visit (HOSPITAL_COMMUNITY): Payer: Self-pay

## 2024-03-03 ENCOUNTER — Other Ambulatory Visit (HOSPITAL_COMMUNITY): Payer: Self-pay

## 2024-05-02 DIAGNOSIS — H16223 Keratoconjunctivitis sicca, not specified as Sjogren's, bilateral: Secondary | ICD-10-CM | POA: Diagnosis not present

## 2024-06-02 ENCOUNTER — Other Ambulatory Visit: Payer: Self-pay

## 2024-06-04 DIAGNOSIS — R21 Rash and other nonspecific skin eruption: Secondary | ICD-10-CM | POA: Diagnosis not present

## 2024-07-05 ENCOUNTER — Encounter: Payer: Self-pay | Admitting: Gastroenterology

## 2024-08-15 ENCOUNTER — Encounter: Payer: Self-pay | Admitting: Radiology

## 2024-08-25 DIAGNOSIS — D2271 Melanocytic nevi of right lower limb, including hip: Secondary | ICD-10-CM | POA: Diagnosis not present

## 2024-08-25 DIAGNOSIS — L820 Inflamed seborrheic keratosis: Secondary | ICD-10-CM | POA: Diagnosis not present

## 2024-08-25 DIAGNOSIS — L821 Other seborrheic keratosis: Secondary | ICD-10-CM | POA: Diagnosis not present

## 2024-08-25 DIAGNOSIS — L570 Actinic keratosis: Secondary | ICD-10-CM | POA: Diagnosis not present

## 2024-08-25 DIAGNOSIS — D2261 Melanocytic nevi of right upper limb, including shoulder: Secondary | ICD-10-CM | POA: Diagnosis not present

## 2024-08-25 DIAGNOSIS — L814 Other melanin hyperpigmentation: Secondary | ICD-10-CM | POA: Diagnosis not present

## 2024-08-26 ENCOUNTER — Ambulatory Visit (INDEPENDENT_AMBULATORY_CARE_PROVIDER_SITE_OTHER): Admitting: Gastroenterology

## 2024-08-26 ENCOUNTER — Encounter: Payer: Self-pay | Admitting: Gastroenterology

## 2024-08-26 VITALS — BP 116/60 | HR 81 | Ht <= 58 in | Wt 162.0 lb

## 2024-08-26 DIAGNOSIS — K219 Gastro-esophageal reflux disease without esophagitis: Secondary | ICD-10-CM

## 2024-08-26 DIAGNOSIS — Z791 Long term (current) use of non-steroidal anti-inflammatories (NSAID): Secondary | ICD-10-CM | POA: Diagnosis not present

## 2024-08-26 NOTE — Progress Notes (Signed)
 Reviewed. Okay to establish care with me as requested

## 2024-08-26 NOTE — Patient Instructions (Signed)
 You have been scheduled for an endoscopy. Please follow written instructions given to you at your visit today.  If you use inhalers (even only as needed), please bring them with you on the day of your procedure.  If you take any of the following medications, they will need to be adjusted prior to your procedure:   DO NOT TAKE 7 DAYS PRIOR TO TEST- Trulicity (dulaglutide) Ozempic, Wegovy (semaglutide) Mounjaro, Zepbound (tirzepatide) Bydureon Bcise (exanatide extended release)  DO NOT TAKE 1 DAY PRIOR TO YOUR TEST Rybelsus (semaglutide) Adlyxin (lixisenatide) Victoza (liraglutide) Byetta (exanatide) ___________________________________________________________________________   If your blood pressure at your visit was 140/90 or greater, please contact your primary care physician to follow up on this.  _______________________________________________________  If you are age 2 or older, your body mass index should be between 23-30. Your Body mass index is 33.86 kg/m. If this is out of the aforementioned range listed, please consider follow up with your Primary Care Provider.  If you are age 83 or younger, your body mass index should be between 19-25. Your Body mass index is 33.86 kg/m. If this is out of the aformentioned range listed, please consider follow up with your Primary Care Provider.   ________________________________________________________  The [facility] providers would like to encourage you to use MYCHART to communicate with providers for non-urgent requests or questions.  Due to long hold times on the telephone, sending your provider a message by [facility] may be a faster and more efficient way to get a response.  Please allow 48 business hours for a response.  Please remember that this is for non-urgent requests.  _______________________________________________________  [facility] is using a team-based approach to care.  Your team is made up of your doctor and  two to three APPS. Our APPS (Nurse Practitioners and Physician Assistants) work with your physician to ensure care continuity for you. They are fully qualified to address your health concerns and develop a treatment plan. They communicate directly with your gastroenterologist to care for you. Seeing the Advanced Practice Practitioners on your physician's team can help you by facilitating care more promptly, often allowing for earlier appointments, access to diagnostic testing, procedures, and other specialty referrals.

## 2024-08-26 NOTE — Progress Notes (Signed)
 Chief Complaint: GERD Primary GI MD: Unassigned  HPI: 58 year old female history of hypertension and anxiety presents for evaluation of GERD  Patient states over the last few months she has been experiencing unrelenting GERD.  He has been trying to control it with over-the-counter Pepcid 20 mg twice daily.  She states that is characterized with burning with eating and often wakes her up at nighttime.  No previous EGD.  She does take Excedrin on a daily basis for many years for history of headaches.  Denies nausea/vomiting.  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  Denies change in bowel habits, rectal bleeding, weight loss  Denies family history of colon cancer states her last colonoscopy was in 2019 but unsure what showed    PREVIOUS GI WORKUP   Colonoscopy with Dr. Luis in 2019 - we will obtain this record. Patient is unsure what it showed or her recall  Past Medical History:  Diagnosis Date   Anxiety    Hypertension     Past Surgical History:  Procedure Laterality Date   ABDOMINAL HYSTERECTOMY      Current Outpatient Medications  Medication Sig Dispense Refill   ALPRAZolam  (XANAX ) 0.25 MG tablet Take 1 tablet (0.25 mg total) by mouth every 8 (eight) hours as needed. 30 tablet 0   estradiol  (ESTRACE ) 2 MG tablet Take 1 tablet (2 mg total) by mouth daily. 90 tablet 4   ibuprofen  (ADVIL ) 600 MG tablet Take 1 tablet (600 mg total) by mouth every 6 (six) hours as needed. 30 tablet 0   venlafaxine  XR (EFFEXOR -XR) 150 MG 24 hr capsule TAKE 1 CAPSULE BY MOUTH EVERY MORNING 90 capsule 0   No current facility-administered medications for this visit.    Allergies as of 08/26/2024 - Review Complete 08/26/2024  Allergen Reaction Noted   Ciprofloxacin   12/06/2018   Macrobid [nitrofurantoin macrocrystal] Other (See Comments) 10/30/2016   Amoxicillin Rash 10/30/2016    History reviewed. No pertinent family history.  Social  History   Socioeconomic History   Marital status: Married    Spouse name: Not on file   Number of children: Not on file   Years of education: Not on file   Highest education level: Not on file  Occupational History   Not on file  Tobacco Use   Smoking status: Never   Smokeless tobacco: Never  Vaping Use   Vaping status: Never Used  Substance and Sexual Activity   Alcohol use: Yes    Comment: occ   Drug use: No   Sexual activity: Not on file  Other Topics Concern   Not on file  Social History Narrative   Not on file   Social Drivers of Health   Financial Resource Strain: Not on file  Food Insecurity: Not on file  Transportation Needs: Not on file  Physical Activity: Not on file  Stress: Not on file  Social Connections: Not on file  Intimate Partner Violence: Not on file    Review of Systems:    Constitutional: No weight loss, fever, chills, weakness or fatigue HEENT: Eyes: No change in vision               Ears, Nose, Throat:  No change in hearing or congestion Skin: No rash or itching Cardiovascular: No chest pain, chest pressure or palpitations   Respiratory: No SOB or cough Gastrointestinal: See HPI and otherwise negative Genitourinary: No dysuria or change in urinary frequency Neurological:  No headache, dizziness or syncope Musculoskeletal: No new muscle or joint pain Hematologic: No bleeding or bruising Psychiatric: No history of depression or anxiety    Physical Exam:  Vital signs: BP 116/60   Pulse 81   Ht 4' 10 (1.473 m)   Wt 162 lb (73.5 kg)   BMI 33.86 kg/m   Constitutional: NAD, alert and cooperative Head:  Normocephalic and atraumatic. Eyes:   PEERL, EOMI. No icterus. Conjunctiva pink. Respiratory: Respirations even and unlabored. Lungs clear to auscultation bilaterally.   No wheezes, crackles, or rhonchi.  Cardiovascular:  Regular rate and rhythm. No peripheral edema, cyanosis or pallor.  Gastrointestinal:  Soft, nondistended, nontender.  No rebound or guarding. Normal bowel sounds. No appreciable masses or hepatomegaly. Rectal:  Declines Msk:  Symmetrical without gross deformities. Without edema, no deformity or joint abnormality.  Neurologic:  Alert and  oriented x4;  grossly normal neurologically.  Skin:   Dry and intact without significant lesions or rashes. Psychiatric: Oriented to person, place and time. Demonstrates good judgement and reason without abnormal affect or behaviors.   RELEVANT LABS AND IMAGING: CBC    Component Value Date/Time   WBC 7.0 12/06/2018 1032   RBC 4.54 12/06/2018 1032   HGB 11.8 (L) 12/06/2018 1032   HCT 38.4 12/06/2018 1032   PLT 350 12/06/2018 1032   MCV 84.6 12/06/2018 1032   MCH 26.0 12/06/2018 1032   MCHC 30.7 12/06/2018 1032   RDW 13.6 12/06/2018 1032   LYMPHSABS 1.5 12/06/2018 1032   MONOABS 0.5 12/06/2018 1032   EOSABS 0.2 12/06/2018 1032   BASOSABS 0.0 12/06/2018 1032    CMP     Component Value Date/Time   NA 139 12/06/2018 1032   K 3.6 12/06/2018 1032   CL 106 12/06/2018 1032   CO2 24 12/06/2018 1032   GLUCOSE 97 12/06/2018 1032   BUN 11 12/06/2018 1032   CREATININE 0.56 12/06/2018 1032   CALCIUM 9.1 12/06/2018 1032   PROT 7.8 12/06/2018 1032   ALBUMIN 4.2 12/06/2018 1032   AST 40 12/06/2018 1032   ALT 35 12/06/2018 1032   ALKPHOS 50 12/06/2018 1032   BILITOT 0.5 12/06/2018 1032   GFRNONAA >60 12/06/2018 1032   GFRAA >60 12/06/2018 1032     Assessment/Plan:   GERD Persistent GERD despite famotidine 20 mg twice daily, nocturnal symptoms, extensive history of daily NSAID use with Excedrin for headaches. - EGD for further evaluation - Pending EGD findings will likely put on PPI, patient would like to hold off at this time - I thoroughly discussed the procedure with the patient (at bedside) to include nature of the procedure, alternatives, benefits, and risks (including but not limited to bleeding, infection, perforation, anesthesia/cardiac pulmonary  complications).  Patient verbalized understanding and gave verbal consent to proceed with procedure.   Colon cancer screening Reported colonoscopy in 2019 with Dr. Luis.  Unsure if she has to come back in 5 or 10 years.  No family history of colon cancer. - Obtain previous records   Desmond Szabo [place], PA-C [facility] 08/26/2024, 9:32 AM  Cc: No ref. provider found

## 2024-09-07 ENCOUNTER — Other Ambulatory Visit (HOSPITAL_COMMUNITY): Payer: Self-pay

## 2024-09-07 MED ORDER — ESTRADIOL 2 MG PO TABS
2.0000 mg | ORAL_TABLET | Freq: Every day | ORAL | 0 refills | Status: AC
  Filled 2024-09-07: qty 90, 90d supply, fill #0

## 2024-09-20 ENCOUNTER — Encounter: Admitting: Internal Medicine

## 2024-09-20 DIAGNOSIS — Z1231 Encounter for screening mammogram for malignant neoplasm of breast: Secondary | ICD-10-CM | POA: Diagnosis not present

## 2024-09-20 DIAGNOSIS — Z13820 Encounter for screening for osteoporosis: Secondary | ICD-10-CM | POA: Diagnosis not present

## 2024-09-20 DIAGNOSIS — Z01419 Encounter for gynecological examination (general) (routine) without abnormal findings: Secondary | ICD-10-CM | POA: Diagnosis not present

## 2024-09-20 DIAGNOSIS — Z6832 Body mass index (BMI) 32.0-32.9, adult: Secondary | ICD-10-CM | POA: Diagnosis not present

## 2024-09-22 ENCOUNTER — Encounter: Payer: Self-pay | Admitting: Internal Medicine

## 2024-09-22 ENCOUNTER — Ambulatory Visit (AMBULATORY_SURGERY_CENTER): Admitting: Internal Medicine

## 2024-09-22 VITALS — BP 110/65 | HR 67 | Temp 97.7°F | Resp 18 | Ht <= 58 in | Wt 162.0 lb

## 2024-09-22 DIAGNOSIS — K449 Diaphragmatic hernia without obstruction or gangrene: Secondary | ICD-10-CM | POA: Diagnosis not present

## 2024-09-22 DIAGNOSIS — K219 Gastro-esophageal reflux disease without esophagitis: Secondary | ICD-10-CM | POA: Diagnosis not present

## 2024-09-22 DIAGNOSIS — Z791 Long term (current) use of non-steroidal anti-inflammatories (NSAID): Secondary | ICD-10-CM

## 2024-09-22 MED ORDER — SODIUM CHLORIDE 0.9 % IV SOLN: 500.0000 mL | INTRAVENOUS | Status: DC

## 2024-09-22 MED ORDER — PANTOPRAZOLE SODIUM 40 MG PO TBEC: 40.0000 mg | DELAYED_RELEASE_TABLET | Freq: Every day | ORAL | 11 refills | Status: DC

## 2024-09-22 NOTE — Progress Notes (Signed)
 Report to PACU, RN, vss, BBS= Clear.

## 2024-09-22 NOTE — Op Note (Signed)
 [facility] Patient Name: [name] Procedure Date: 09/22/2024 10:48 AM MRN: [id number] Endoscopist: Norleen SAILOR. Abran , MD, [id number] Age: 58 Referring MD:  Date of Birth: 12/12/1965 Gender: Female Account #: [id number] Procedure:                Upper GI endoscopy Indications:              Esophageal reflux Medicines:                Monitored Anesthesia Care Procedure:                Pre-Anesthesia Assessment:                           - Prior to the procedure, a History and Physical                            was performed, and patient medications and                            allergies were reviewed. The patient's tolerance of                            previous anesthesia was also reviewed. The risks                            and benefits of the procedure and the sedation                            options and risks were discussed with the patient.                            All questions were answered, and informed consent                            was obtained. Prior Anticoagulants: The patient has                            taken no anticoagulant or antiplatelet agents. ASA                            Grade Assessment: II - A patient with mild systemic                            disease. After reviewing the risks and benefits,                            the patient was deemed in satisfactory condition to                            undergo the procedure.                           After obtaining informed consent, the endoscope was  passed under direct vision. Throughout the                            procedure, the patient's blood pressure, pulse, and                            oxygen saturations were monitored continuously. The                            GIF [id number] #[id number] was introduced through the                            mouth, and advanced to the second part of duodenum.                            The upper GI endoscopy was  accomplished without                            difficulty. The patient tolerated the procedure                            well. Scope In: Scope Out: Findings:                 The esophagus was normal. No inflammation. No                            Barrett's.                           The stomach was normal except for moderate hiatal                            hernia.                           The examined duodenum was normal.                           The cardia and gastric fundus were normal on                            retroflexion. Complications:            No immediate complications. Estimated Blood Loss:     Estimated blood loss: none. Impression:               - Normal esophagus.                           - Normal stomach. Moderate hiatal hernia.                           - Normal examined duodenum.                           - No specimens collected. Recommendation:           - Patient has  a contact number available for                            emergencies. The signs and symptoms of potential                            delayed complications were discussed with the                            patient. Return to normal activities tomorrow.                            Written discharge instructions were provided to the                            patient.                           - Resume previous diet.                           - Continue present medications.                           - Reflux precautions. Please provide                           - PRESCRIBE PANTOPRAZOLE 40 mg daily; #30; 11                            refills. Take 1 each morning 30 to 60 minutes                            before breakfast                           - Office follow-up with Dr. Abran in 2 to 3 months Norleen SAILOR. Abran, MD 09/22/2024 11:07:49 AM This report has been signed electronically.

## 2024-09-22 NOTE — Progress Notes (Signed)
 Author: Mollie Nestor HERO, PA-C Author Type: Physician Assistant Certified Filed: 08/26/2024  9:47 AM  Note Status: Signed Cosign: Cosign Not Required Encounter Date: 08/26/2024  Editor: Mollie Nestor HERO [name] (Physician Assistant Certified)              Expand All Collapse All    Chief Complaint: GERD Primary GI MD: Sampson   HPI: 58 year old female history of hypertension and anxiety presents for evaluation of GERD   Patient states over the last few months she has been experiencing unrelenting GERD.  He has been trying to control it with over-the-counter Pepcid 20 mg twice daily.  She states that is characterized with burning with eating and often wakes her up at nighttime.  No previous EGD.  She does take Excedrin on a daily basis for many years for history of headaches.  Denies nausea/vomiting.  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   Denies change in bowel habits, rectal bleeding, weight loss   Denies family history of colon cancer states her last colonoscopy was in 2019 but unsure what showed       PREVIOUS GI WORKUP    Colonoscopy with Dr. Luis in 2019 - we will obtain this record. Patient is unsure what it showed or her recall       Past Medical History:  Diagnosis Date   Anxiety     Hypertension                 Past Surgical History:  Procedure Laterality Date   ABDOMINAL HYSTERECTOMY                    Current Outpatient Medications  Medication Sig Dispense Refill   ALPRAZolam  (XANAX ) 0.25 MG tablet Take 1 tablet (0.25 mg total) by mouth every 8 (eight) hours as needed. 30 tablet 0   estradiol  (ESTRACE ) 2 MG tablet Take 1 tablet (2 mg total) by mouth daily. 90 tablet 4   ibuprofen  (ADVIL ) 600 MG tablet Take 1 tablet (600 mg total) by mouth every 6 (six) hours as needed. 30 tablet 0   venlafaxine  XR (EFFEXOR -XR) 150 MG 24 hr capsule TAKE 1 CAPSULE BY MOUTH EVERY MORNING 90 capsule 0      No  current facility-administered medications for this visit.             Allergies as of 08/26/2024 - Review Complete 08/26/2024  Allergen Reaction Noted   Ciprofloxacin    12/06/2018   Macrobid [nitrofurantoin macrocrystal] Other (See Comments) 10/30/2016   Amoxicillin Rash 10/30/2016      History reviewed. No pertinent family history.       Social History         Socioeconomic History   Marital status: Married      Spouse name: Not on file   Number of children: Not on file   Years of education: Not on file   Highest education level: Not on file  Occupational History   Not on file  Tobacco Use   Smoking status: Never   Smokeless tobacco: Never  Vaping Use   Vaping status: Never Used  Substance and Sexual Activity   Alcohol use: Yes      Comment: occ   Drug use: No   Sexual activity: Not on file  Other Topics Concern   Not on file  Social History Narrative   Not on file    Social Drivers of Health  Financial Resource Strain: Not on file  Food Insecurity: Not on file  Transportation Needs: Not on file  Physical Activity: Not on file  Stress: Not on file  Social Connections: Not on file  Intimate Partner Violence: Not on file      Review of Systems:    Constitutional: No weight loss, fever, chills, weakness or fatigue HEENT: Eyes: No change in vision               Ears, Nose, Throat:  No change in hearing or congestion Skin: No rash or itching Cardiovascular: No chest pain, chest pressure or palpitations   Respiratory: No SOB or cough Gastrointestinal: See HPI and otherwise negative Genitourinary: No dysuria or change in urinary frequency Neurological: No headache, dizziness or syncope Musculoskeletal: No new muscle or joint pain Hematologic: No bleeding or bruising Psychiatric: No history of depression or anxiety      Physical Exam:  Vital signs: BP 116/60   Pulse 81   Ht 4' 10 (1.473 m)   Wt 162 lb (73.5 kg)   BMI 33.86 kg/m     Constitutional: NAD, alert and cooperative Head:  Normocephalic and atraumatic. Eyes:   PEERL, EOMI. No icterus. Conjunctiva pink. Respiratory: Respirations even and unlabored. Lungs clear to auscultation bilaterally.   No wheezes, crackles, or rhonchi.  Cardiovascular:  Regular rate and rhythm. No peripheral edema, cyanosis or pallor.  Gastrointestinal:  Soft, nondistended, nontender. No rebound or guarding. Normal bowel sounds. No appreciable masses or hepatomegaly. Rectal:  Declines Msk:  Symmetrical without gross deformities. Without edema, no deformity or joint abnormality.  Neurologic:  Alert and  oriented x4;  grossly normal neurologically.  Skin:   Dry and intact without significant lesions or rashes. Psychiatric: Oriented to person, place and time. Demonstrates good judgement and reason without abnormal affect or behaviors.     RELEVANT LABS AND IMAGING: CBC Labs (Brief)          Component Value Date/Time    WBC 7.0 12/06/2018 1032    RBC 4.54 12/06/2018 1032    HGB 11.8 (L) 12/06/2018 1032    HCT 38.4 12/06/2018 1032    PLT 350 12/06/2018 1032    MCV 84.6 12/06/2018 1032    MCH 26.0 12/06/2018 1032    MCHC 30.7 12/06/2018 1032    RDW 13.6 12/06/2018 1032    LYMPHSABS 1.5 12/06/2018 1032    MONOABS 0.5 12/06/2018 1032    EOSABS 0.2 12/06/2018 1032    BASOSABS 0.0 12/06/2018 1032        CMP     Labs (Brief)          Component Value Date/Time    NA 139 12/06/2018 1032    K 3.6 12/06/2018 1032    CL 106 12/06/2018 1032    CO2 24 12/06/2018 1032    GLUCOSE 97 12/06/2018 1032    BUN 11 12/06/2018 1032    CREATININE 0.56 12/06/2018 1032    CALCIUM 9.1 12/06/2018 1032    PROT 7.8 12/06/2018 1032    ALBUMIN 4.2 12/06/2018 1032    AST 40 12/06/2018 1032    ALT 35 12/06/2018 1032    ALKPHOS 50 12/06/2018 1032    BILITOT 0.5 12/06/2018 1032    GFRNONAA >60 12/06/2018 1032    GFRAA >60 12/06/2018 1032          Assessment/Plan:    GERD Persistent GERD  despite famotidine 20 mg twice daily, nocturnal symptoms, extensive history of daily NSAID use with Excedrin  for headaches. - EGD for further evaluation - Pending EGD findings will likely put on PPI, patient would like to hold off at this time - I thoroughly discussed the procedure with the patient (at bedside) to include nature of the procedure, alternatives, benefits, and risks (including but not limited to bleeding, infection, perforation, anesthesia/cardiac pulmonary complications).  Patient verbalized understanding and gave verbal consent to proceed with procedure.    Colon cancer screening Reported colonoscopy in 2019 with Dr. Luis.  Unsure if she has to come back in 5 or 10 years.  No family history of colon cancer. - Obtain previous records     Bayley Mollie, PA-C [facility] 08/26/2024, 9:32 AM

## 2024-09-22 NOTE — Progress Notes (Signed)
 Pt's states no medical or surgical changes since previsit or office visit.

## 2024-09-22 NOTE — Patient Instructions (Addendum)
 Follow reflux precautions. Pantoprazole sent to pharmacy. Take 40mg  daily 30 to 60 mins before breakfast.   YOU HAD AN ENDOSCOPIC PROCEDURE TODAY AT THE [facility]:   Refer to the procedure report that was given to you for any specific questions about what was found during the examination.  If the procedure report does not answer your questions, please call your gastroenterologist to clarify.  If you requested that your care partner not be given the details of your procedure findings, then the procedure report has been included in a sealed envelope for you to review at your convenience later.  YOU SHOULD EXPECT: Some feelings of bloating in the abdomen. Passage of more gas than usual.  Walking can help get rid of the air that was put into your GI tract during the procedure and reduce the bloating. If you had a lower endoscopy (such as a colonoscopy or flexible sigmoidoscopy) you may notice spotting of blood in your stool or on the toilet paper. If you underwent a bowel prep for your procedure, you may not have a normal bowel movement for a few days.  Please Note:  You might notice some irritation and congestion in your nose or some drainage.  This is from the oxygen used during your procedure.  There is no need for concern and it should clear up in a day or so.  SYMPTOMS TO REPORT IMMEDIATELY:   Following upper endoscopy (EGD)  Vomiting of blood or coffee ground material  New chest pain or pain under the shoulder blades  Painful or persistently difficult swallowing  New shortness of breath  Fever of 100F or higher  Black, tarry-looking stools  For urgent or emergent issues, a gastroenterologist can be reached at any hour by calling [phone]. Do not use MyChart messaging for urgent concerns.    DIET:  We do recommend a small meal at first, but then you may proceed to your regular diet.  Drink plenty of fluids but you should avoid alcoholic beverages for 24  hours.  ACTIVITY:  You should plan to take it easy for the rest of today and you should NOT DRIVE or use heavy machinery until tomorrow (because of the sedation medicines used during the test).    FOLLOW UP: Our staff will call the number listed on your records the next business day following your procedure.  We will call around 7:15- 8:00 am to check on you and address any questions or concerns that you may have regarding the information given to you following your procedure. If we do not reach you, we will leave a message.     If any biopsies were taken you will be contacted by phone or by letter within the next 1-3 weeks.  Please call us  at [phone] if you have not heard about the biopsies in 3 weeks.    SIGNATURES/CONFIDENTIALITY: You and/or your care partner have signed paperwork which will be entered into your electronic medical record.  These signatures attest to the fact that that the information above on your After Visit Summary has been reviewed and is understood.  Full responsibility of the confidentiality of this discharge information lies with you and/or your care-partner.

## 2024-09-23 ENCOUNTER — Telehealth: Payer: Self-pay

## 2024-09-23 NOTE — Telephone Encounter (Signed)
 Left message on answering machine.

## 2024-09-27 ENCOUNTER — Other Ambulatory Visit: Payer: Self-pay | Admitting: Obstetrics and Gynecology

## 2024-09-27 DIAGNOSIS — R928 Other abnormal and inconclusive findings on diagnostic imaging of breast: Secondary | ICD-10-CM

## 2024-10-12 ENCOUNTER — Ambulatory Visit
Admission: RE | Admit: 2024-10-12 | Discharge: 2024-10-12 | Disposition: A | Discharge status: Home / Self Care | Source: Ambulatory Visit | Attending: Obstetrics and Gynecology | Admitting: Obstetrics and Gynecology

## 2024-10-12 DIAGNOSIS — N6489 Other specified disorders of breast: Secondary | ICD-10-CM | POA: Diagnosis not present

## 2024-10-12 DIAGNOSIS — R928 Other abnormal and inconclusive findings on diagnostic imaging of breast: Secondary | ICD-10-CM

## 2024-10-21 ENCOUNTER — Telehealth: Payer: Self-pay | Admitting: Gastroenterology

## 2024-10-21 NOTE — Telephone Encounter (Signed)
 Colonoscopy 02/01/2018 with Dr. Luis - Excellent prep - Normal colonoscopy -- Repeat 10 years

## 2024-11-18 ENCOUNTER — Telehealth: Payer: Self-pay | Admitting: Internal Medicine

## 2024-11-18 DIAGNOSIS — K219 Gastro-esophageal reflux disease without esophagitis: Secondary | ICD-10-CM

## 2024-11-18 MED ORDER — PANTOPRAZOLE SODIUM 40 MG PO TBEC: 40.0000 mg | DELAYED_RELEASE_TABLET | Freq: Every day | ORAL | 1 refills | Status: AC

## 2024-11-18 NOTE — Telephone Encounter (Signed)
 Incoming call from pt regarding Rx refill pantoprazole  (PROTONIX ). Pt stated she will run out before her visit Pt scheduled for (03/31). Please advise. Thank you.

## 2024-11-18 NOTE — Telephone Encounter (Signed)
 Prescription sent to patient's pharmacy until scheduled appt.

## 2024-11-25 ENCOUNTER — Ambulatory Visit: Admitting: Internal Medicine

## 2025-01-10 ENCOUNTER — Ambulatory Visit: Admitting: Internal Medicine
